# Patient Record
Sex: Male | Born: 1950 | Race: Black or African American | Hispanic: No | Marital: Married | Smoking: Never smoker
Health system: Southern US, Community
[De-identification: ages and names within clinical notes are randomized; demographics above are authoritative.]

## PROBLEM LIST (undated history)

## (undated) DIAGNOSIS — E119 Type 2 diabetes mellitus without complications: Secondary | ICD-10-CM

## (undated) DIAGNOSIS — M24661 Ankylosis, right knee: Secondary | ICD-10-CM

## (undated) DIAGNOSIS — M1990 Unspecified osteoarthritis, unspecified site: Secondary | ICD-10-CM

## (undated) DIAGNOSIS — E785 Hyperlipidemia, unspecified: Secondary | ICD-10-CM

## (undated) DIAGNOSIS — I10 Essential (primary) hypertension: Secondary | ICD-10-CM

## (undated) DIAGNOSIS — K5730 Diverticulosis of large intestine without perforation or abscess without bleeding: Secondary | ICD-10-CM

## (undated) DIAGNOSIS — N401 Enlarged prostate with lower urinary tract symptoms: Secondary | ICD-10-CM

## (undated) HISTORY — PX: JOINT REPLACEMENT: SHX530

## (undated) HISTORY — DX: Type 2 diabetes mellitus without complications: E11.9

## (undated) HISTORY — PX: COLONOSCOPY W/ BIOPSIES: SHX1374

---

## 2009-07-11 ENCOUNTER — Ambulatory Visit: Payer: Self-pay | Admitting: Orthopedic Surgery

## 2014-04-30 HISTORY — PX: COLONOSCOPY W/ BIOPSIES: SHX1374

## 2014-06-01 ENCOUNTER — Ambulatory Visit: Payer: Self-pay | Admitting: Gastroenterology

## 2017-09-11 ENCOUNTER — Other Ambulatory Visit: Payer: Self-pay | Admitting: Orthopedic Surgery

## 2017-09-11 ENCOUNTER — Ambulatory Visit
Admission: RE | Admit: 2017-09-11 | Discharge: 2017-09-11 | Disposition: A | Discharge status: Home / Self Care | Payer: BC Managed Care – PPO | Source: Ambulatory Visit | Attending: Orthopedic Surgery | Admitting: Orthopedic Surgery

## 2017-09-11 DIAGNOSIS — R52 Pain, unspecified: Secondary | ICD-10-CM

## 2017-09-11 DIAGNOSIS — M25461 Effusion, right knee: Secondary | ICD-10-CM | POA: Diagnosis not present

## 2017-09-11 DIAGNOSIS — M170 Bilateral primary osteoarthritis of knee: Secondary | ICD-10-CM | POA: Insufficient documentation

## 2019-06-12 ENCOUNTER — Ambulatory Visit: Payer: BC Managed Care – PPO | Attending: Internal Medicine

## 2019-06-12 DIAGNOSIS — Z23 Encounter for immunization: Secondary | ICD-10-CM | POA: Insufficient documentation

## 2019-06-12 NOTE — Progress Notes (Signed)
   Covid-19 Vaccination Clinic  Name:  Steven Buck    MRN: [id number] DOB: 26-Nov-1950  06/12/2019  [name] was observed post Covid-19 immunization for 15 minutes without incidence. He was provided with Vaccine Information Sheet and instruction to access the V-Safe system.   [name] was instructed to call 911 with any severe reactions post vaccine: Marland Kitchen Difficulty breathing  . Swelling of your face and throat  . A fast heartbeat  . A bad rash all over your body  . Dizziness and weakness    Immunizations Administered    Name Date Dose VIS Date Route   Moderna COVID-19 Vaccine 06/12/2019  9:50 AM 0.5 mL 03/31/2019 Intramuscular   Manufacturer: Moderna   Lot: [id number]   NDC: 83358-251-89

## 2019-07-13 ENCOUNTER — Ambulatory Visit: Payer: BC Managed Care – PPO | Attending: Internal Medicine

## 2019-07-13 DIAGNOSIS — Z23 Encounter for immunization: Secondary | ICD-10-CM

## 2019-07-13 NOTE — Progress Notes (Signed)
   Covid-19 Vaccination Clinic  Name:  ADIEL ERNEY    MRN: [id number] DOB: 09-29-1950  07/13/2019  Mr. Cookson was observed post Covid-19 immunization for 15 minutes without incident. He was provided with Vaccine Information Sheet and instruction to access the V-Safe system.   Mr. Blessinger was instructed to call 911 with any severe reactions post vaccine: Marland Kitchen Difficulty breathing  . Swelling of face and throat  . A fast heartbeat  . A bad rash all over body  . Dizziness and weakness   Immunizations Administered    Name Date Dose VIS Date Route   Moderna COVID-19 Vaccine 07/13/2019 10:04 AM 0.5 mL 03/31/2019 Intramuscular   Manufacturer: Moderna   Lot: [id number]   NDC: 57473-403-70

## 2019-07-27 IMAGING — CR DG KNEE STANDING AP BILAT
1 series · 1 of 1 positions shown · non-contrast
Comparison: None.

CLINICAL DATA: History of arthritis.  Pain.

EXAM:
BILATERAL KNEES STANDING - 1 VIEW; RIGHT KNEE - 1-2 VIEW

[dg knee bilateral standing ap]
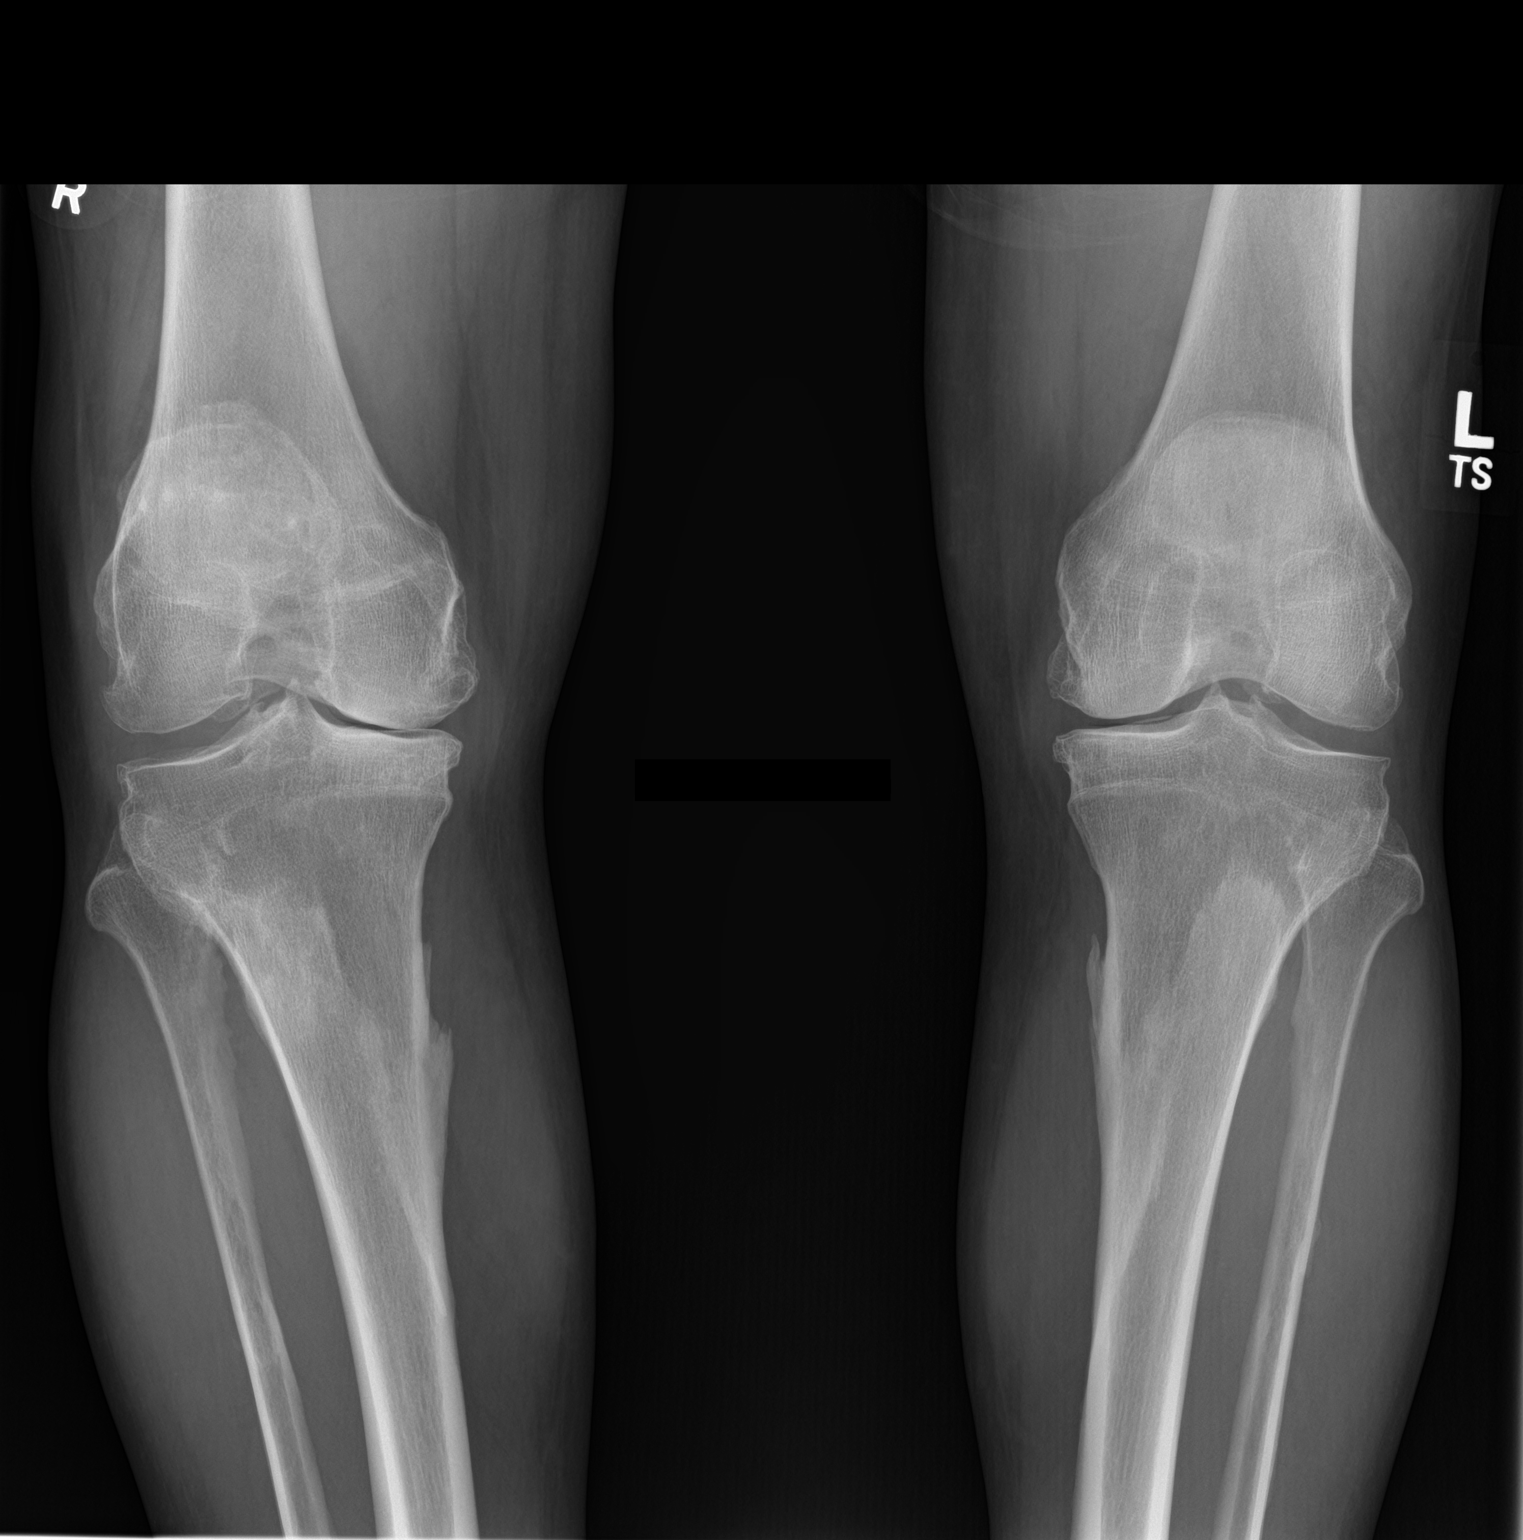

[1 of 1 positions shown; findings below may reference images not displayed]

FINDINGS: AP standing views of the knees bilaterally demonstrate joint space
narrowing and spurring most pronounced in the medial compartments.
Lateral view of the right knee demonstrates severe osteoarthritic
changes in the patellofemoral compartment with joint space narrowing
and extensive spurring. Small to moderate joint effusion within the
right knee. Cannot determine joint effusion on the left without a
lateral view. No acute bony abnormality. Specifically, no fracture,
subluxation, or dislocation.
IMPRESSION: Moderate bilateral degenerative changes as above. No acute bony
abnormality.

Small to moderate right joint effusion.

## 2020-04-30 HISTORY — PX: REPLACEMENT TOTAL KNEE: SUR1224

## 2020-10-19 HISTORY — PX: TOTAL KNEE ARTHROPLASTY: SHX125

## 2020-10-21 ENCOUNTER — Emergency Department: Payer: BC Managed Care – PPO

## 2020-10-21 ENCOUNTER — Emergency Department
Admission: EM | Admit: 2020-10-21 | Discharge: 2020-10-21 | Disposition: A | Discharge status: Home / Self Care | Payer: BC Managed Care – PPO | Attending: Emergency Medicine | Admitting: Emergency Medicine

## 2020-10-21 ENCOUNTER — Other Ambulatory Visit: Payer: Self-pay

## 2020-10-21 DIAGNOSIS — E860 Dehydration: Secondary | ICD-10-CM | POA: Insufficient documentation

## 2020-10-21 DIAGNOSIS — Z7984 Long term (current) use of oral hypoglycemic drugs: Secondary | ICD-10-CM | POA: Diagnosis not present

## 2020-10-21 DIAGNOSIS — R42 Dizziness and giddiness: Secondary | ICD-10-CM | POA: Diagnosis present

## 2020-10-21 DIAGNOSIS — R7303 Prediabetes: Secondary | ICD-10-CM | POA: Diagnosis not present

## 2020-10-21 DIAGNOSIS — M25561 Pain in right knee: Secondary | ICD-10-CM | POA: Insufficient documentation

## 2020-10-21 DIAGNOSIS — I951 Orthostatic hypotension: Secondary | ICD-10-CM | POA: Diagnosis not present

## 2020-10-21 DIAGNOSIS — W1830XA Fall on same level, unspecified, initial encounter: Secondary | ICD-10-CM | POA: Insufficient documentation

## 2020-10-21 DIAGNOSIS — W19XXXA Unspecified fall, initial encounter: Secondary | ICD-10-CM

## 2020-10-21 LAB — URINALYSIS, COMPLETE (UACMP) WITH MICROSCOPIC
Bacteria, UA: NONE SEEN
Bilirubin Urine: NEGATIVE
Glucose, UA: 50 mg/dL — AB
Hgb urine dipstick: NEGATIVE
Ketones, ur: NEGATIVE mg/dL
Leukocytes,Ua: NEGATIVE
Nitrite: NEGATIVE
Protein, ur: NEGATIVE mg/dL
Specific Gravity, Urine: 1.014 (ref 1.005–1.030)
Squamous Epithelial / HPF: NONE SEEN (ref 0–5)
pH: 7 (ref 5.0–8.0)

## 2020-10-21 LAB — CBC
HCT: 34.8 % — ABNORMAL LOW (ref 39.0–52.0)
Hemoglobin: 12 g/dL — ABNORMAL LOW (ref 13.0–17.0)
MCH: 30.8 pg (ref 26.0–34.0)
MCHC: 34.5 g/dL (ref 30.0–36.0)
MCV: 89.2 fL (ref 80.0–100.0)
Platelets: 218 10*3/uL (ref 150–400)
RBC: 3.9 MIL/uL — ABNORMAL LOW (ref 4.22–5.81)
RDW: 12.8 % (ref 11.5–15.5)
WBC: 11.2 10*3/uL — ABNORMAL HIGH (ref 4.0–10.5)
nRBC: 0 % (ref 0.0–0.2)

## 2020-10-21 LAB — TROPONIN I (HIGH SENSITIVITY): Troponin I (High Sensitivity): 12 ng/L (ref <18)

## 2020-10-21 LAB — BASIC METABOLIC PANEL
Anion gap: 7 (ref 5–15)
BUN: 12 mg/dL (ref 8–23)
CO2: 26 mmol/L (ref 22–32)
Calcium: 8.2 mg/dL — ABNORMAL LOW (ref 8.9–10.3)
Chloride: 103 mmol/L (ref 98–111)
Creatinine, Ser: 1 mg/dL (ref 0.61–1.24)
GFR, Estimated: >60 mL/min (ref >60)
Glucose, Bld: 131 mg/dL — ABNORMAL HIGH (ref 70–99)
Potassium: 5 mmol/L (ref 3.5–5.1)
Sodium: 136 mmol/L (ref 135–145)

## 2020-10-21 MED ORDER — LACTATED RINGERS IV BOLUS
1000.0000 mL | Freq: Once | INTRAVENOUS | Status: AC
  Administered 2020-10-21: 1000 mL via INTRAVENOUS

## 2020-10-21 NOTE — ED Provider Notes (Signed)
[facility] Emergency Department Provider Note  ____________________________________________   Event Date/Time   First MD Initiated Contact with Patient 10/21/20 [phone]     (approximate)  I have reviewed the triage vital signs and the nursing notes.   HISTORY  Chief Complaint Fall and Dizziness   HPI Steven Buck is a 70 y.o. male with a past medical history of HDL and prediabetes on metformin as well as recent right knee replacement on 6/22 presents for assessment of some dizziness and a fall today.  Patient states he has been feeling a little dizzy since the surgery and today was using his walker in his home when he states it went forward a little faster than he intended to when he fell backwards onto his bottom.  He states he currently does not have any pain in his bottom back and did not hit his head or have loss of consciousness.  He states he has little soreness in his knee and otherwise has been using his walker without too much difficulty.  He did take an oxycodone this morning.  He thinks he has been probably drinking a little less and eating less than usual denies any other recent sick symptoms including pain in his right ankle, hip, left lower extremity, back, chest, abdomen, head, ears, eyes, nose or throat.  No recent cough, fever, rash or other recent falls or injuries.  No other acute concerns at this time.  He is on ASA but no other anticoagulation.  Patient states he has no new pain in his knee and did not hit his knee experience any acute change in his knee pain after a fall.         No past medical history on file.  There are no problems to display for this patient.   Prior to Admission medications   Not on File    Allergies Patient has no allergy information on record.  No family history on file.  Social History    Review of Systems  Review of Systems  Constitutional:  Positive for malaise/fatigue. Negative for chills and  fever.  HENT:  Negative for sore throat.   Eyes:  Negative for pain.  Respiratory:  Negative for cough and stridor.   Cardiovascular:  Negative for chest pain.  Gastrointestinal:  Negative for vomiting.  Genitourinary:  Negative for dysuria.  Musculoskeletal:  Positive for joint pain.  Skin:  Negative for rash.  Neurological:  Positive for dizziness. Negative for seizures and headaches.  Psychiatric/Behavioral:  Negative for suicidal ideas.   All other systems reviewed and are negative.    ____________________________________________   PHYSICAL EXAM:  VITAL SIGNS: ED Triage Vitals  Enc Vitals Group     BP 10/21/20 0908 (!) 107/55     Pulse Rate 10/21/20 0908 66     Resp 10/21/20 0908 18     Temp 10/21/20 0908 99.3 F (37.4 C)     Temp Source 10/21/20 0908 Oral     SpO2 10/21/20 0908 99 %     Weight 10/21/20 0909 185 lb (83.9 kg)     Height 10/21/20 0909 6' (1.829 m)     Head Circumference --      Peak Flow --      Pain Score 10/21/20 0908 0     Pain Loc --      Pain Edu? --      Excl. in GC? --    Vitals:   10/21/20 1200 10/21/20 1230  BP:  126/74 121/69  Pulse:  68  Resp:  18  Temp:    SpO2:  100%   Physical Exam Vitals and nursing note reviewed.  Constitutional:      Appearance: He is well-developed.  HENT:     Head: Normocephalic and atraumatic.     Right Ear: External ear normal.     Left Ear: External ear normal.     Nose: Nose normal.     Mouth/Throat:     Mouth: Mucous membranes are dry.  Eyes:     Conjunctiva/sclera: Conjunctivae normal.  Cardiovascular:     Rate and Rhythm: Normal rate and regular rhythm.     Heart sounds: No murmur heard. Pulmonary:     Effort: Pulmonary effort is normal. No respiratory distress.     Breath sounds: Normal breath sounds.  Abdominal:     Palpations: Abdomen is soft.     Tenderness: There is no abdominal tenderness.  Musculoskeletal:     Cervical back: Neck supple.  Skin:    General: Skin is warm and dry.      Capillary Refill: Capillary refill takes 2 to 3 seconds.  Neurological:     Mental Status: He is alert and oriented to person, place, and time.    .  Some edema around the right knee with dressing in place.  No bleeding or drainage.  Compartments are soft.  2+ DP and radial pulses.  No tenderness step-offs deformities over the C/T/L-spine.  No trauma to the face scalp head neck chest abdomen or back.  No evidence of new trauma to the extremities. ____________________________________________   LABS (all labs ordered are listed, but only abnormal results are displayed)  Labs Reviewed  BASIC METABOLIC PANEL - Abnormal; Notable for the following components:      Result Value   Glucose, Bld 131 (*)    Calcium 8.2 (*)    All other components within normal limits  CBC - Abnormal; Notable for the following components:   WBC 11.2 (*)    RBC 3.90 (*)    Hemoglobin 12.0 (*)    HCT 34.8 (*)    All other components within normal limits  URINALYSIS, COMPLETE (UACMP) WITH MICROSCOPIC - Abnormal; Notable for the following components:   Color, Urine YELLOW (*)    APPearance CLEAR (*)    Glucose, UA 50 (*)    All other components within normal limits  CBG MONITORING, ED  TROPONIN I (HIGH SENSITIVITY)   ____________________________________________  EKG  Sinus rhythm with a ventricular rate of 66, normal axis, somewhat peaked T waves without any other clear evidence of acute ischemia or significant arrhythmia. ____________________________________________  RADIOLOGY  ED MD interpretation: Chest x-ray is unremarkable for evidence of pneumonia, pneumothorax, effusion, edema or other clear acute intrathoracic process.  Ultrasound of the right lower extremity shows no evidence of DVT.  Official radiology report(s): DG Chest 2 View  Result Date: 10/21/2020 CLINICAL DATA:  Pt here with a fall and dizziness. Pt had a knee replacement on 10/19/20 and has been dizzy and clammy since yesterday. Pt  was walking with his walker today and fell and landed on his butt. Pt denies any trouble breathing or pain in chest. EXAM: CHEST - 2 VIEW COMPARISON:  None. FINDINGS: The heart size and mediastinal contours are within normal limits. Both lungs are clear. No pleural effusion or pneumothorax. The visualized skeletal structures are unremarkable. IMPRESSION: No active cardiopulmonary disease. Electronically Signed   By: Renard Hamper.D.  On: 10/21/2020 10:36   US Venous Img Lower Unilateral Right  Result Date: 10/21/2020 CLINICAL DATA:  Postoperative leg swelling EXAM: RIGHT LOWER EXTREMITY VENOUS DOPPLER ULTRASOUND TECHNIQUE: Gray-scale sonography with compression, as well as color and duplex ultrasound, were performed to evaluate the deep venous system(s) from the level of the common femoral vein through the popliteal and proximal calf veins. COMPARISON:  None. FINDINGS: VENOUS Normal compressibility of the common femoral, superficial femoral, and popliteal veins, as well as the visualized calf veins. Visualized portions of profunda femoral vein and great saphenous vein unremarkable. No filling defects to suggest DVT on grayscale or color Doppler imaging. Doppler waveforms show normal direction of venous flow, normal respiratory plasticity and response to augmentation. Limited views of the contralateral common femoral vein are unremarkable. OTHER None. Limitations: none IMPRESSION: No evidence of right lower extremity DVT. Electronically Signed   By: Guadlupe SpanishPraneil  Patel M.D.   On: 10/21/2020 11:21    ____________________________________________   PROCEDURES  Procedure(s) performed (including Critical Care):  .1-3 Lead EKG Interpretation  Date/Time: 10/21/2020 1:00 PM Performed by: Gilles ChiquitoSmith, Khizar Fiorella P, MD Authorized by: Gilles ChiquitoSmith, Luiz Trumpower P, MD     Interpretation: normal     ECG rate assessment: normal     Rhythm: sinus rhythm     Ectopy: none     Conduction: normal      ____________________________________________   INITIAL IMPRESSION / ASSESSMENT AND PLAN / ED COURSE      Patient presents with above-stated exam for assessment after a fall that occurred earlier this morning.  This in the setting of right knee replacement.  Patient dates he has been feeling little dizzy since the procedure and thinks his roller got little help him this morning.  He is adamant he did not lose consciousness restraints any other associated recent sick symptoms.  He thinks he fell onto his bottom only denies any back pain or any other acute pain  Overall history is most consistent with likely mechanical fall today in the setting of 2 days of dizziness after recent surgery.  Regard to his dizziness patient is noted to be orthostatic and endorses eating and drinking less for the last couple days.  Chest x-ray is unremarkable for evidence of pneumonia, pneumothorax, effusion, edema or other clear acute intrathoracic process.  Ultrasound of the right lower extremity shows no evidence of DVT.  Overall I have a low suspicion for PE at this time.  BC shows mild leukocytosis of 11.2 likely reactive in the setting of recent surgery and hemoglobin of 12 not suggestive of symptomatic anemia at this time.  BMP shows no significant electrode or metabolic derangements.  Troponin and ECG are not suggestive of ACS and there is no significant arrhythmia identified.  Urine is unremarkable.  Treated with IV fluids and improvement in his blood pressure.  Repeat standing blood pressure after IV fluids and p.o. duration is 121/69.  Patient also denies any dizziness on standing.  Advised him to hydrate and eat plenty of food and follow-up with his orthopedist.  He is amenable to plan.  Discharged stable condition.  Strict return precautions advised and discussed.       ____________________________________________   FINAL CLINICAL IMPRESSION(S) / ED DIAGNOSES  Final diagnoses:  Fall, initial  encounter  Orthostatic dizziness  Dehydration    Medications  lactated ringers bolus 1,000 mL (1,000 mLs Intravenous New Bag/Given 10/21/20 1025)     ED Discharge Orders     None  Note:  This document was prepared using Dragon voice recognition software and may include unintentional dictation errors.    Gilles Chiquito, MD 10/21/20 [phone]

## 2020-10-21 NOTE — ED Notes (Signed)
Pt taken for scans 

## 2020-10-21 NOTE — ED Triage Notes (Signed)
Pt here with a fall and dizziness. Pt had a knee replacement on 10/19/20 and has been dizzy and clammy since yesterday. Pt was walking with his walker today and fell and landed on his butt. Pt denies LOC.

## 2020-10-21 NOTE — ED Notes (Signed)
Dr. Albeiro Trompeter at bedside.

## 2022-07-02 ENCOUNTER — Other Ambulatory Visit: Payer: BC Managed Care – PPO

## 2022-07-02 ENCOUNTER — Other Ambulatory Visit: Payer: Self-pay | Admitting: Internal Medicine

## 2022-07-03 LAB — CMP14+EGFR
ALT: 20 IU/L (ref 0–44)
AST: 28 IU/L (ref 0–40)
Albumin/Globulin Ratio: 1.5 (ref 1.2–2.2)
Albumin: 4 g/dL (ref 3.9–4.9)
Alkaline Phosphatase: 62 IU/L (ref 44–121)
BUN/Creatinine Ratio: 15 (ref 10–24)
BUN: 18 mg/dL (ref 8–27)
Bilirubin Total: 0.4 mg/dL (ref 0.0–1.2)
CO2: 24 mmol/L (ref 20–29)
Calcium: 9.6 mg/dL (ref 8.6–10.2)
Chloride: 101 mmol/L (ref 96–106)
Creatinine, Ser: 1.19 mg/dL (ref 0.76–1.27)
Globulin, Total: 2.7 g/dL (ref 1.5–4.5)
Glucose: 99 mg/dL (ref 70–99)
Potassium: 5.5 mmol/L — ABNORMAL HIGH (ref 3.5–5.2)
Sodium: 139 mmol/L (ref 134–144)
Total Protein: 6.7 g/dL (ref 6.0–8.5)
eGFR: 66 mL/min/{1.73_m2} (ref >59)

## 2022-07-03 LAB — LIPID PANEL W/O CHOL/HDL RATIO
Cholesterol, Total: 157 mg/dL (ref 100–199)
HDL: 76 mg/dL (ref >39)
LDL Chol Calc (NIH): 70 mg/dL (ref 0–99)
Triglycerides: 54 mg/dL (ref 0–149)
VLDL Cholesterol Cal: 11 mg/dL (ref 5–40)

## 2022-07-03 LAB — HGB A1C W/O EAG: Hgb A1c MFr Bld: 6.4 % — ABNORMAL HIGH (ref 4.8–5.6)

## 2022-07-04 ENCOUNTER — Ambulatory Visit (INDEPENDENT_AMBULATORY_CARE_PROVIDER_SITE_OTHER): Payer: BC Managed Care – PPO | Admitting: Internal Medicine

## 2022-07-04 ENCOUNTER — Encounter: Payer: Self-pay | Admitting: Internal Medicine

## 2022-07-04 VITALS — BP 120/80 | HR 84 | Ht 72.0 in | Wt 196.8 lb

## 2022-07-04 DIAGNOSIS — E119 Type 2 diabetes mellitus without complications: Secondary | ICD-10-CM

## 2022-07-04 DIAGNOSIS — E7800 Pure hypercholesterolemia, unspecified: Secondary | ICD-10-CM

## 2022-07-04 LAB — GLUCOSE, POCT (MANUAL RESULT ENTRY): POC Glucose: 112 mg/dl — AB (ref 70–99)

## 2022-07-04 NOTE — Progress Notes (Signed)
Established Patient Office Visit  Subjective:  Patient ID: Steven Buck, male    DOB: 03-20-1951  Age: 72 y.o. MRN: [id number]  Chief Complaint  Patient presents with   Follow-up    Lab results    No new complaints, here for lab review and medication refills. Labs reviewed and notable for well controlled diabetes although A1c higher with LDL at target. Denies any hypoglyemic episodes.    Past Medical History:  Diagnosis Date   Diabetes mellitus without complication ([place])    Hypertension     History reviewed. No pertinent surgical history.  Social History   Socioeconomic History   Marital status: Married    Spouse name: Not on file   Number of children: Not on file   Years of education: Not on file   Highest education level: Not on file  Occupational History   Not on file  Tobacco Use   Smoking status: Never   Smokeless tobacco: Never  Substance and Sexual Activity   Alcohol use: Never   Drug use: Never   Sexual activity: Not on file  Other Topics Concern   Not on file  Social History Narrative   Not on file   Social Determinants of Health   Financial Resource Strain: Not on file  Food Insecurity: Not on file  Transportation Needs: Not on file  Physical Activity: Not on file  Stress: Not on file  Social Connections: Not on file  Intimate Partner Violence: Not on file    History reviewed. No pertinent family history.  No Known Allergies  Review of Systems  Constitutional: Negative.   HENT: Negative.    Eyes: Negative.   Respiratory: Negative.    Cardiovascular: Negative.   Gastrointestinal: Negative.   Genitourinary: Negative.        Delayed ejaculation  Musculoskeletal:  Positive for joint pain.       Right knee arthralgia  Skin: Negative.   Neurological: Negative.   Endo/Heme/Allergies: Negative.        Objective:   BP 120/80   Pulse 84   Ht 6' (1.829 m)   Wt 196 lb 12.8 oz (89.3 kg)   SpO2 96%   BMI 26.69 kg/m   Vitals:    07/04/22 1003  BP: 120/80  Pulse: 84  Height: 6' (1.829 m)  Weight: 196 lb 12.8 oz (89.3 kg)  SpO2: 96%  BMI (Calculated): 26.69    Physical Exam Vitals reviewed.  Constitutional:      Appearance: Normal appearance.  HENT:     Head: Normocephalic.     Left Ear: There is no impacted cerumen.     Nose: Nose normal.     Mouth/Throat:     Mouth: Mucous membranes are moist.     Pharynx: No posterior oropharyngeal erythema.  Eyes:     Extraocular Movements: Extraocular movements intact.     Pupils: Pupils are equal, round, and reactive to light.  Cardiovascular:     Rate and Rhythm: Regular rhythm.     Chest Wall: PMI is not displaced.     Pulses: Normal pulses.     Heart sounds: Normal heart sounds. No murmur heard. Pulmonary:     Effort: Pulmonary effort is normal.     Breath sounds: Normal air entry. No rhonchi or rales.  Abdominal:     General: Abdomen is flat. Bowel sounds are normal. There is no distension.     Palpations: Abdomen is soft. There is no hepatomegaly, splenomegaly or mass.  Tenderness: There is no abdominal tenderness.  Musculoskeletal:     Cervical back: Normal range of motion and neck supple.     Right lower leg: No edema.     Left lower leg: No edema.     Comments: Limping gait  Skin:    General: Skin is warm and dry.  Neurological:     General: No focal deficit present.     Mental Status: He is alert and oriented to person, place, and time.     Cranial Nerves: No cranial nerve deficit.     Motor: No weakness.  Psychiatric:        Mood and Affect: Mood normal.        Behavior: Behavior normal.      Results for orders placed or performed in visit on 07/04/22  POCT Glucose (CBG)  Result Value Ref Range   POC Glucose 112 (A) 70 - 99 mg/dl    Recent Results (from the past 2160 hour(Ismael Karge))  CMP14+EGFR     Status: Abnormal   Collection Time: 07/02/22  9:40 AM  Result Value Ref Range   Glucose 99 70 - 99 mg/dL   BUN 18 8 - 27 mg/dL    Creatinine, Ser 1.19 0.76 - 1.27 mg/dL   eGFR 66 >59 mL/min/1.73   BUN/Creatinine Ratio 15 10 - 24   Sodium 139 134 - 144 mmol/L   Potassium 5.5 (H) 3.5 - 5.2 mmol/L   Chloride 101 96 - 106 mmol/L   CO2 24 20 - 29 mmol/L   Calcium 9.6 8.6 - 10.2 mg/dL   Total Protein 6.7 6.0 - 8.5 g/dL   Albumin 4.0 3.9 - 4.9 g/dL   Globulin, Total 2.7 1.5 - 4.5 g/dL   Albumin/Globulin Ratio 1.5 1.2 - 2.2   Bilirubin Total 0.4 0.0 - 1.2 mg/dL   Alkaline Phosphatase 62 44 - 121 IU/L   AST 28 0 - 40 IU/L   ALT 20 0 - 44 IU/L  Lipid Panel w/o Chol/HDL Ratio     Status: None   Collection Time: 07/02/22  9:40 AM  Result Value Ref Range   Cholesterol, Total 157 100 - 199 mg/dL   Triglycerides 54 0 - 149 mg/dL   HDL 76 >39 mg/dL   VLDL Cholesterol Cal 11 5 - 40 mg/dL   LDL Chol Calc (NIH) 70 0 - 99 mg/dL  Hgb A1c w/o eAG     Status: Abnormal   Collection Time: 07/02/22  9:40 AM  Result Value Ref Range   Hgb A1c MFr Bld 6.4 (H) 4.8 - 5.6 %    Comment:          Prediabetes: 5.7 - 6.4          Diabetes: >6.4          Glycemic control for adults with diabetes: <7.0   POCT Glucose (CBG)     Status: Abnormal   Collection Time: 07/04/22 10:15 AM  Result Value Ref Range   POC Glucose 112 (A) 70 - 99 mg/dl      Assessment & Plan:   Problem List Items Addressed This Visit   None Visit Diagnoses     Type 2 diabetes mellitus without complication, without long-term current use of insulin (HCC)    -  Primary   Relevant Medications   atorvastatin (LIPITOR) 10 MG tablet   aspirin EC 81 MG tablet   FARXIGA 10 MG TABS tablet   metFORMIN (GLUCOPHAGE-XR) 750 MG 24 hr tablet  pioglitazone (ACTOS) 30 MG tablet   Other Relevant Orders   POCT Glucose (CBG) (Completed)     1. Type 2 diabetes mellitus without complication, without long-term current use of insulin (HCC) - POCT Glucose (CBG) - Hemoglobin A1c; Future - Comprehensive metabolic panel; Future - Lipid panel; Future  2. Pure  hypercholesterolemia    No follow-ups on file.   Total time spent: 20 minutes  Volanda Napoleon, MD  07/04/2022

## 2022-09-05 IMAGING — CR DG CHEST 2V
2 series · 2 of 2 positions shown · non-contrast
Comparison: None.

CLINICAL DATA: Pt here with a fall and dizziness. Pt had a knee
replacement on 10/19/20 and has been dizzy and clammy since
yesterday. Pt was walking with his walker today and fell and landed
on his butt. Pt denies any trouble breathing or pain in chest.

EXAM:
CHEST - 2 VIEW

[chest lat]
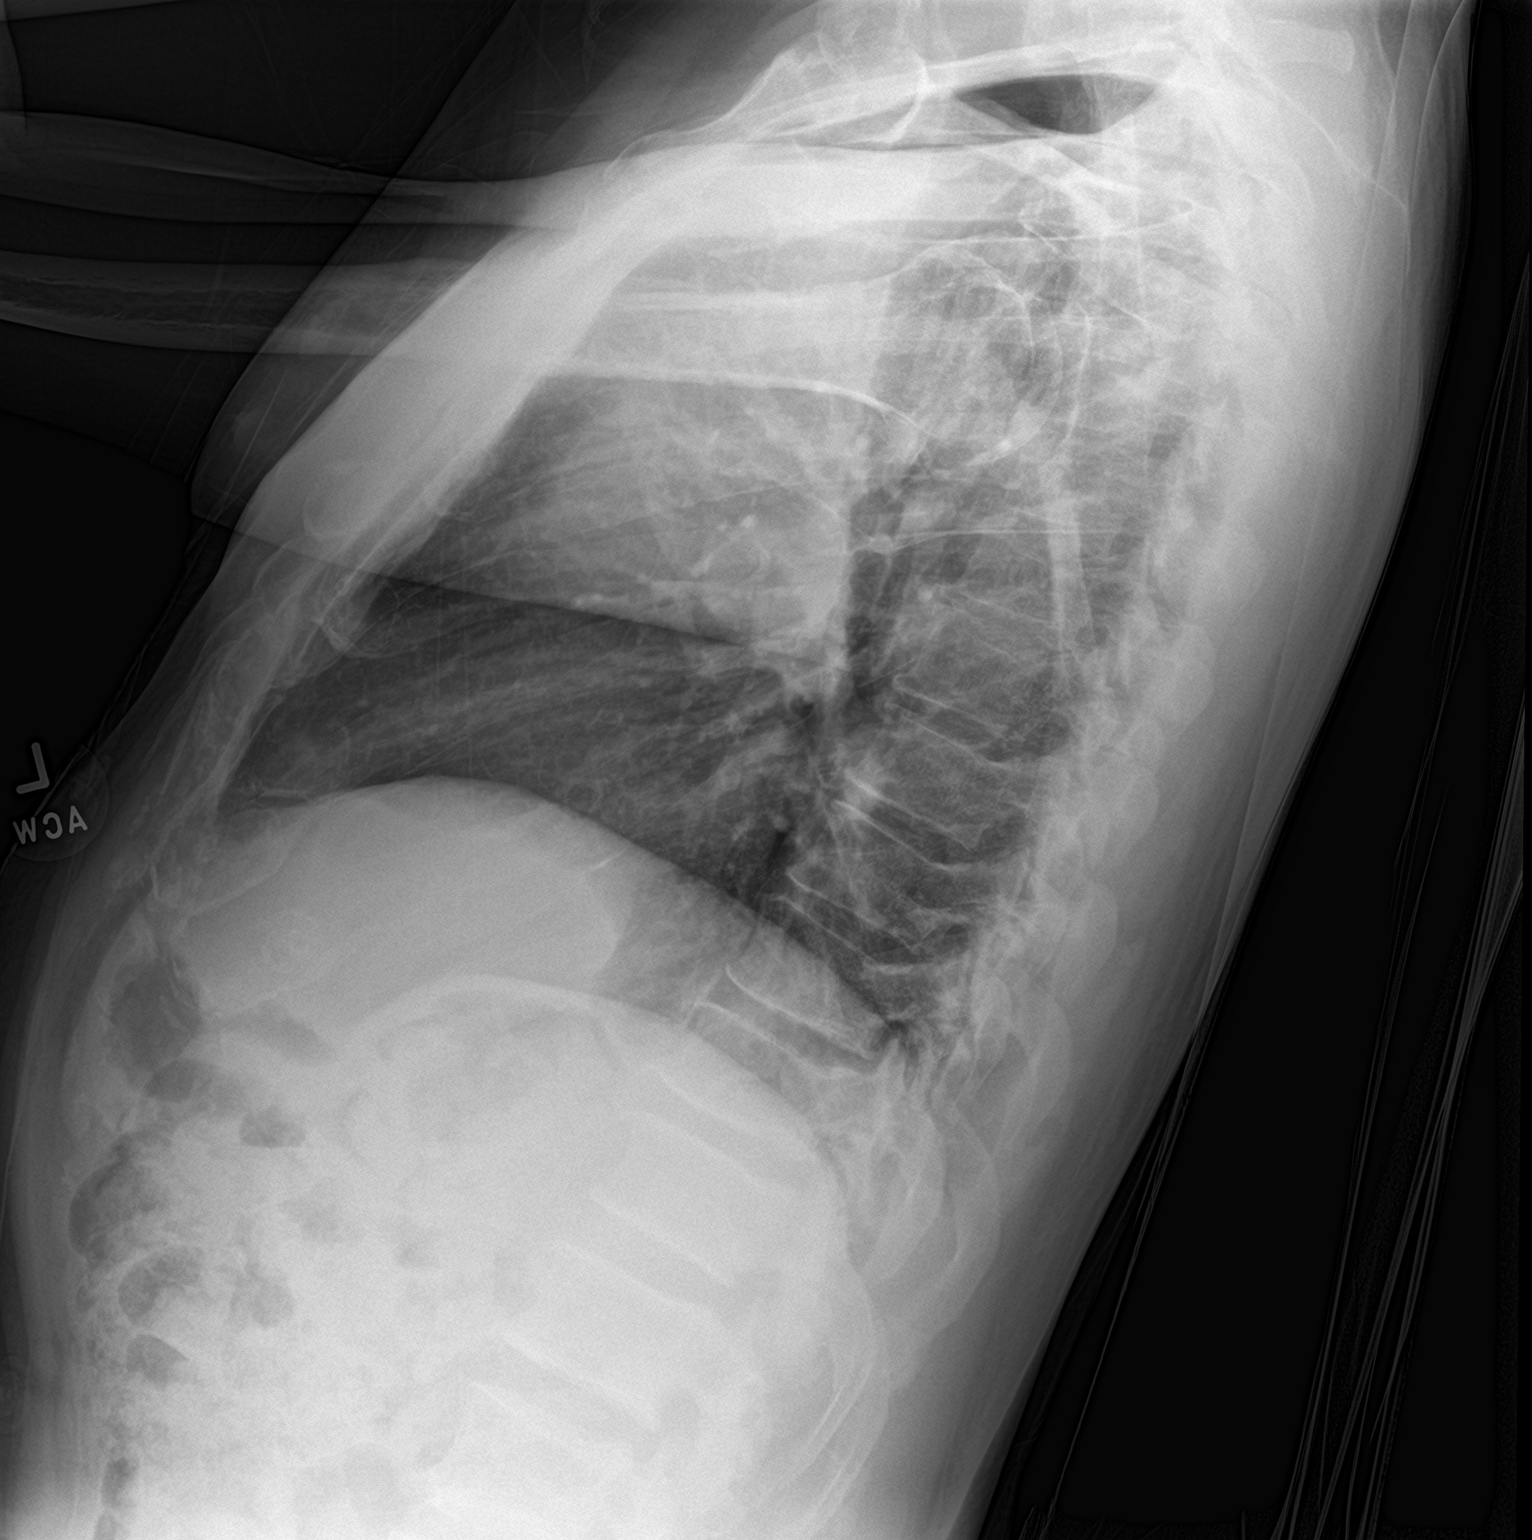

[chest ap]
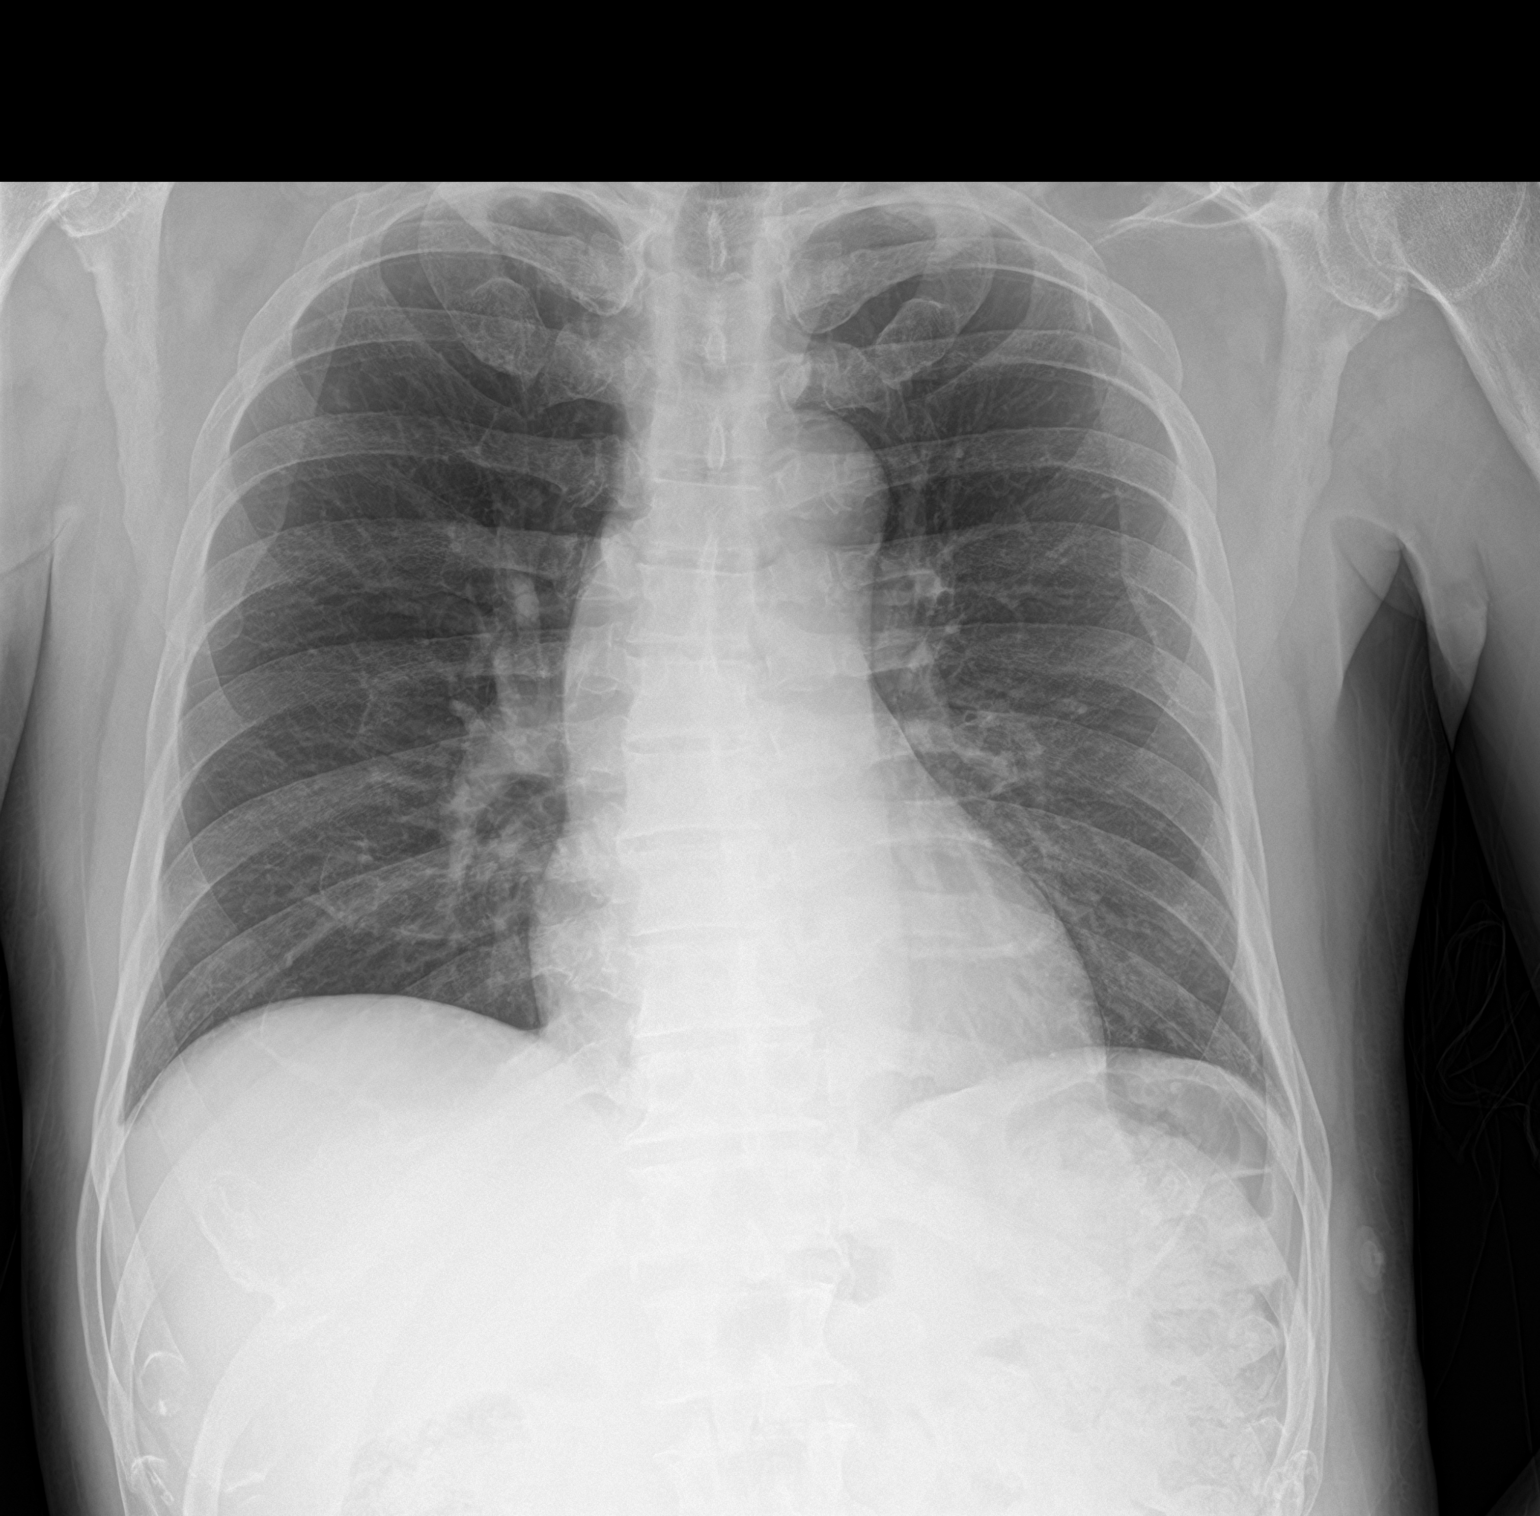

[2 of 2 positions shown; findings below may reference images not displayed]

FINDINGS: The heart size and mediastinal contours are within normal limits.
Both lungs are clear. No pleural effusion or pneumothorax. The
visualized skeletal structures are unremarkable.
IMPRESSION: No active cardiopulmonary disease.

## 2022-09-05 IMAGING — US US EXTREM LOW VENOUS*R*
1 series · 14 of 24 positions shown · non-contrast
Comparison: None.

CLINICAL DATA: Postoperative leg swelling

EXAM:
RIGHT LOWER EXTREMITY VENOUS DOPPLER ULTRASOUND
TECHNIQUE: Gray-scale sonography with compression, as well as color and duplex
ultrasound, were performed to evaluate the deep venous system(s)
from the level of the common femoral vein through the popliteal and
proximal calf veins.

[Series 1: us venous img lower uni right (dvt) · portal-venous · 14 of 33 slices shown]
[im 1/33]
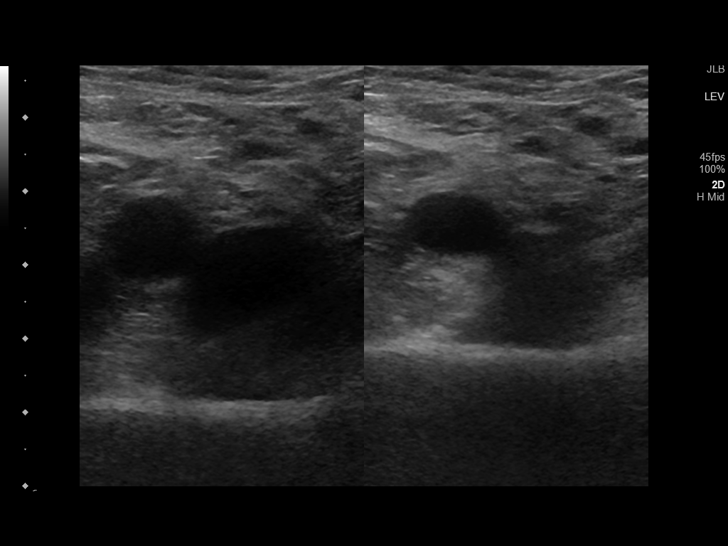
[im 3/33]
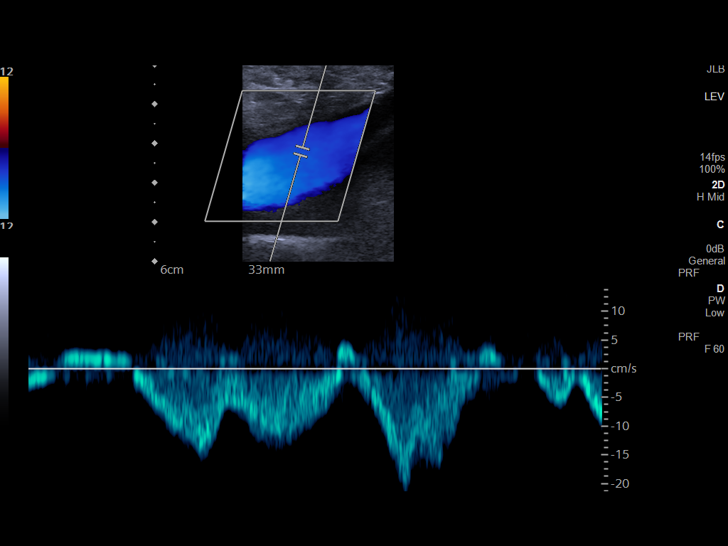
[im 6/33]
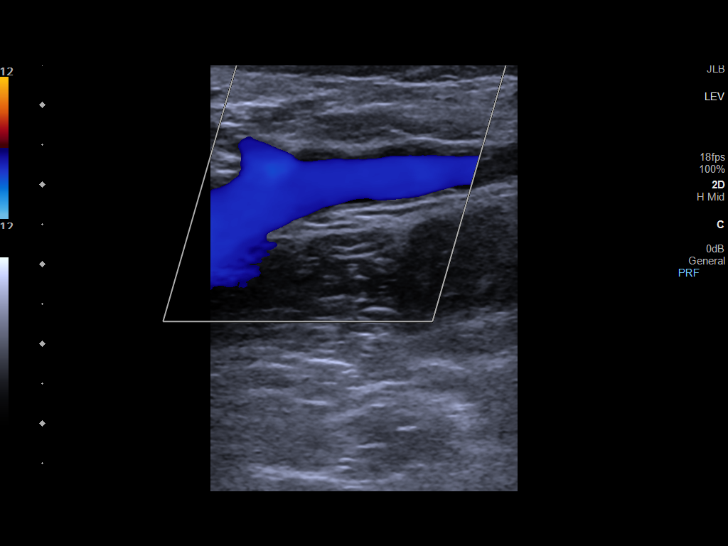
[im 9/33]
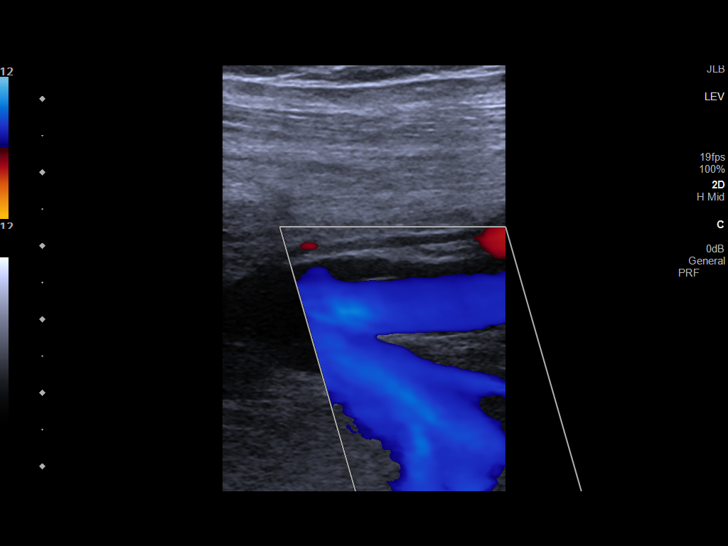
[im 10/33]
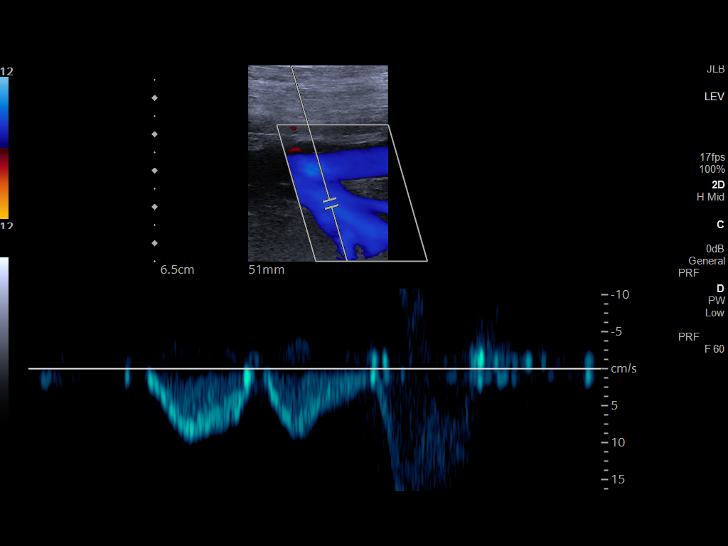
[im 13/33]
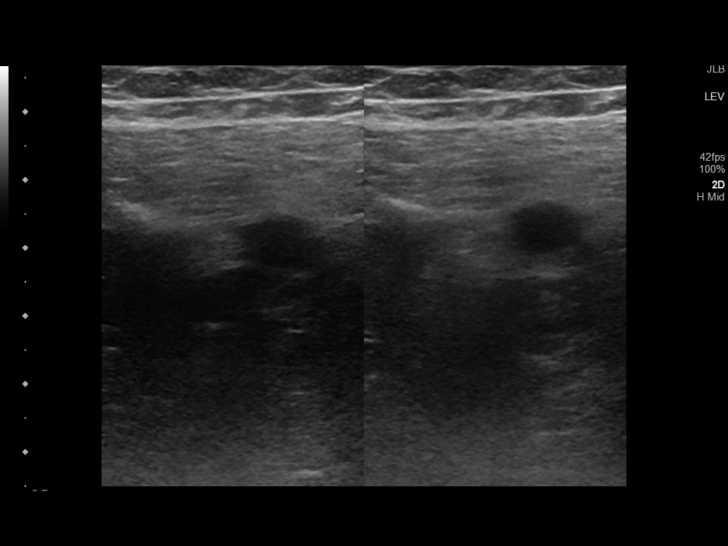
[im 16/33]
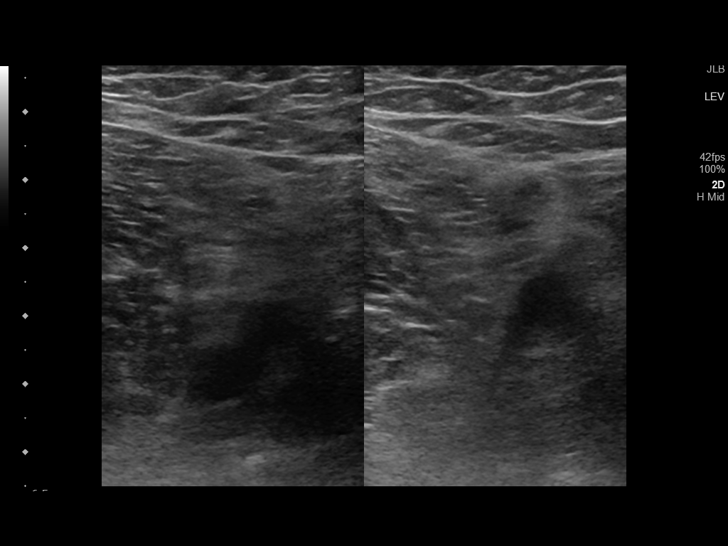
[im 17/33]
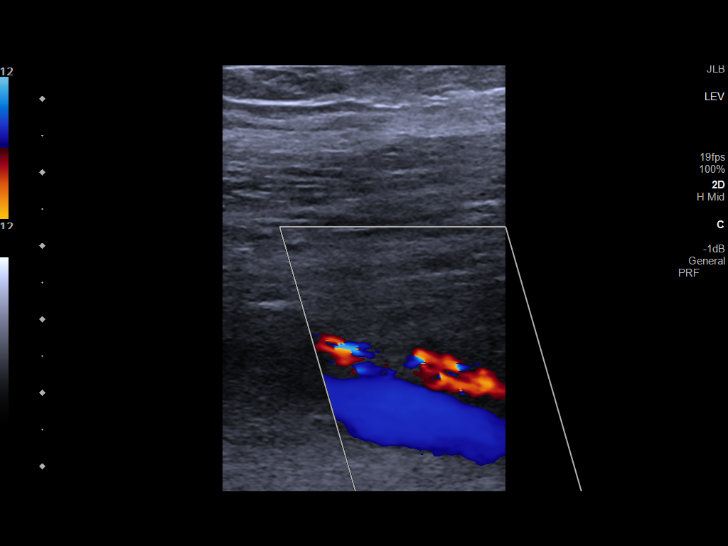
[im 20/33]
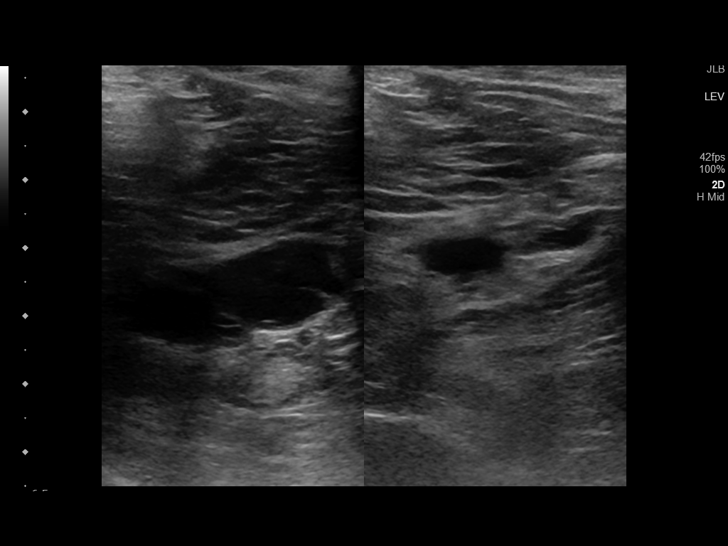
[im 23/33]
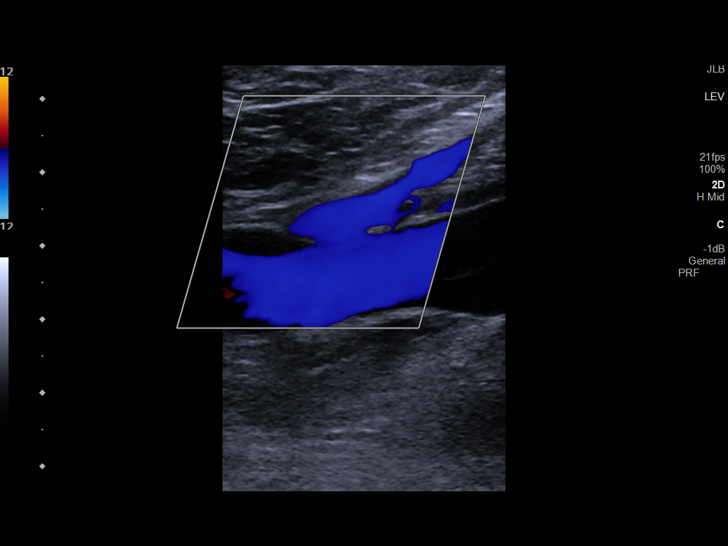
[im 26/33]
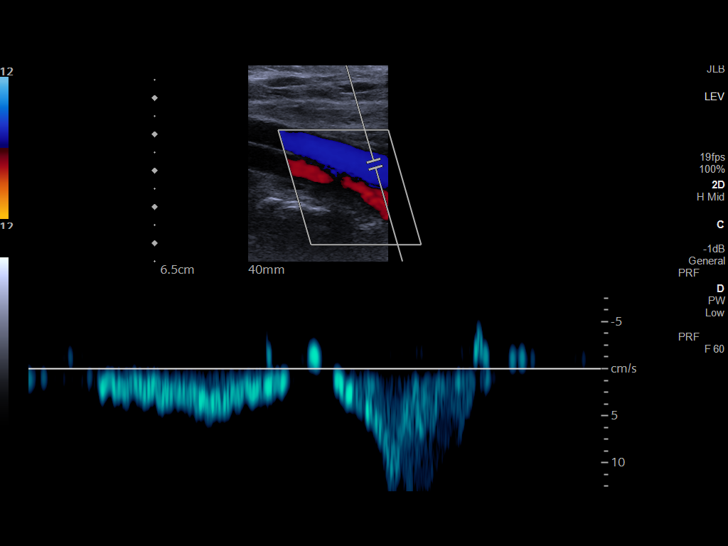
[im 27/33]
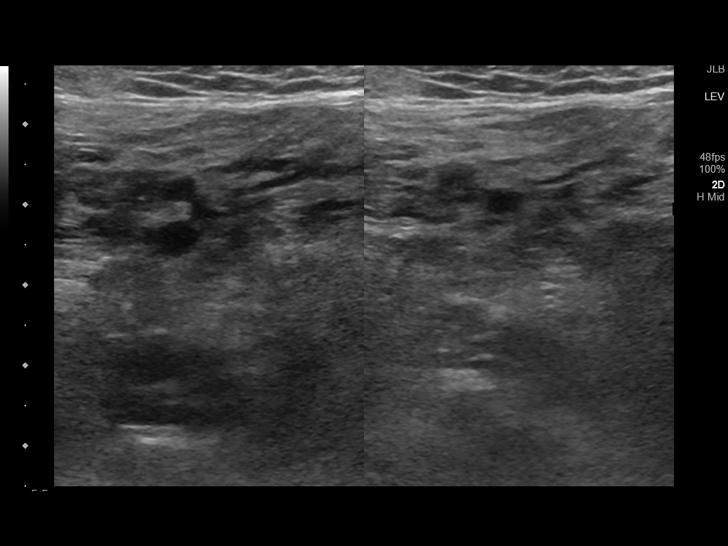
[im 30/33]
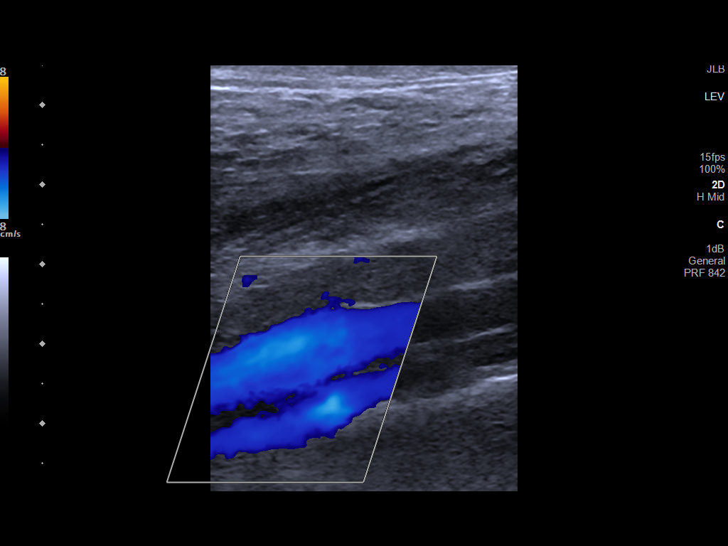
[im 33/33]
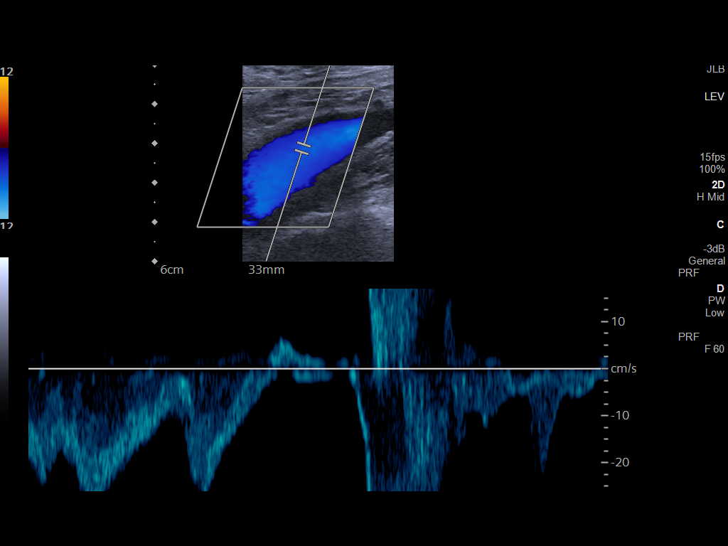

[14 of 24 positions shown; findings below may reference images not displayed]

FINDINGS: VENOUS

Normal compressibility of the common femoral, superficial femoral,
and popliteal veins, as well as the visualized calf veins.
Visualized portions of profunda femoral vein and great saphenous
vein unremarkable. No filling defects to suggest DVT on grayscale or
color Doppler imaging. Doppler waveforms show normal direction of
venous flow, normal respiratory plasticity and response to
augmentation.

Limited views of the contralateral common femoral vein are
unremarkable.

OTHER

None.

Limitations: none
IMPRESSION: No evidence of right lower extremity DVT.

## 2022-10-05 ENCOUNTER — Ambulatory Visit: Payer: BC Managed Care – PPO | Admitting: Internal Medicine

## 2022-10-05 ENCOUNTER — Other Ambulatory Visit: Payer: BC Managed Care – PPO

## 2022-10-05 DIAGNOSIS — E119 Type 2 diabetes mellitus without complications: Secondary | ICD-10-CM

## 2022-10-06 LAB — COMPREHENSIVE METABOLIC PANEL
ALT: 23 IU/L (ref 0–44)
AST: 28 IU/L (ref 0–40)
Albumin/Globulin Ratio: 1.5 (ref 1.2–2.2)
Albumin: 4 g/dL (ref 3.9–4.9)
Alkaline Phosphatase: 56 IU/L (ref 44–121)
BUN/Creatinine Ratio: 19 (ref 10–24)
BUN: 19 mg/dL (ref 8–27)
Bilirubin Total: 0.6 mg/dL (ref 0.0–1.2)
CO2: 26 mmol/L (ref 20–29)
Calcium: 9.4 mg/dL (ref 8.6–10.2)
Chloride: 104 mmol/L (ref 96–106)
Creatinine, Ser: 1 mg/dL (ref 0.76–1.27)
Globulin, Total: 2.7 g/dL (ref 1.5–4.5)
Glucose: 102 mg/dL — ABNORMAL HIGH (ref 70–99)
Potassium: 5.1 mmol/L (ref 3.5–5.2)
Sodium: 141 mmol/L (ref 134–144)
Total Protein: 6.7 g/dL (ref 6.0–8.5)
eGFR: 81 mL/min/{1.73_m2} (ref >59)

## 2022-10-06 LAB — LIPID PANEL
Chol/HDL Ratio: 1.9 ratio (ref 0.0–5.0)
Cholesterol, Total: 160 mg/dL (ref 100–199)
HDL: 84 mg/dL (ref >39)
LDL Chol Calc (NIH): 66 mg/dL (ref 0–99)
Triglycerides: 44 mg/dL (ref 0–149)
VLDL Cholesterol Cal: 10 mg/dL (ref 5–40)

## 2022-10-06 LAB — HEMOGLOBIN A1C
Est. average glucose Bld gHb Est-mCnc: 137 mg/dL
Hgb A1c MFr Bld: 6.4 % — ABNORMAL HIGH (ref 4.8–5.6)

## 2022-10-09 ENCOUNTER — Ambulatory Visit: Payer: BC Managed Care – PPO | Admitting: Internal Medicine

## 2022-10-09 ENCOUNTER — Encounter: Payer: Self-pay | Admitting: Internal Medicine

## 2022-10-09 VITALS — BP 118/82 | HR 52 | Ht 72.0 in | Wt 192.6 lb

## 2022-10-09 DIAGNOSIS — I10 Essential (primary) hypertension: Secondary | ICD-10-CM | POA: Diagnosis not present

## 2022-10-09 DIAGNOSIS — E119 Type 2 diabetes mellitus without complications: Secondary | ICD-10-CM | POA: Diagnosis not present

## 2022-10-09 DIAGNOSIS — N400 Enlarged prostate without lower urinary tract symptoms: Secondary | ICD-10-CM

## 2022-10-09 DIAGNOSIS — E7800 Pure hypercholesterolemia, unspecified: Secondary | ICD-10-CM | POA: Insufficient documentation

## 2022-10-09 LAB — POCT CBG (FASTING - GLUCOSE)-MANUAL ENTRY: Glucose Fasting, POC: 100 mg/dL — AB (ref 70–99)

## 2022-10-09 MED ORDER — METFORMIN HCL ER 750 MG PO TB24: 750.0000 mg | ORAL_TABLET | Freq: Every day | ORAL | 0 refills | Status: DC

## 2022-10-09 MED ORDER — FARXIGA 10 MG PO TABS: 10.0000 mg | ORAL_TABLET | Freq: Every day | ORAL | 2 refills | Status: AC

## 2022-10-09 MED ORDER — ATORVASTATIN CALCIUM 10 MG PO TABS: 10.0000 mg | ORAL_TABLET | Freq: Every day | ORAL | 0 refills | Status: DC

## 2022-10-09 MED ORDER — PIOGLITAZONE HCL 30 MG PO TABS: 30.0000 mg | ORAL_TABLET | Freq: Every day | ORAL | 0 refills | Status: DC

## 2022-10-09 NOTE — Progress Notes (Signed)
Established Patient Office Visit  Subjective:  Patient ID: Steven Buck, male    DOB: 04-19-1951  Age: 72 y.o. MRN: [id number]  Chief Complaint  Patient presents with   Follow-up    3 month follow up, discuss lab results.    No new complaints, here for lab review and medication refills. Labs reviewed and notable for well controlled diabetes, A1c at target, lipids at target with unremarkable cmp. Denies any hypoglycemic episodes and home bg readings have been at target. Scheduled to undergo revision of R knee replacement on 11/05/22.    No other concerns at this time.   Past Medical History:  Diagnosis Date   Diabetes mellitus without complication (HCC)     Past Surgical History:  Procedure Laterality Date   JOINT REPLACEMENT     REPLACEMENT TOTAL KNEE Right 2022    Social History   Socioeconomic History   Marital status: Married    Spouse name: Not on file   Number of children: Not on file   Years of education: Not on file   Highest education level: Not on file  Occupational History   Occupation: Janitor  Tobacco Use   Smoking status: Never   Smokeless tobacco: Never  Substance and Sexual Activity   Alcohol use: Yes   Drug use: Never   Sexual activity: Yes  Other Topics Concern   Not on file  Social History Narrative   Not on file   Social Determinants of Health   Financial Resource Strain: Not on file  Food Insecurity: Not on file  Transportation Needs: Not on file  Physical Activity: Not on file  Stress: Not on file  Social Connections: Not on file  Intimate Partner Violence: Not on file    No family history on file.  No Known Allergies  Review of Systems  Constitutional: Negative.   HENT: Negative.    Eyes: Negative.   Respiratory: Negative.    Cardiovascular: Negative.   Gastrointestinal: Negative.   Genitourinary: Negative.        Delayed ejaculation  Musculoskeletal:  Positive for joint pain.       Right knee arthralgia  Skin:  Negative.   Neurological: Negative.   Endo/Heme/Allergies: Negative.        Objective:   BP 118/82   Pulse (!) 52   Ht 6' (1.829 m)   Wt 192 lb 9.6 oz (87.4 kg)   SpO2 96%   BMI 26.12 kg/m   Vitals:   10/09/22 1126  BP: 118/82  Pulse: (!) 52  Height: 6' (1.829 m)  Weight: 192 lb 9.6 oz (87.4 kg)  SpO2: 96%  BMI (Calculated): 26.12    Physical Exam Vitals reviewed.  Constitutional:      Appearance: Normal appearance.  HENT:     Head: Normocephalic.     Left Ear: There is no impacted cerumen.     Nose: Nose normal.     Mouth/Throat:     Mouth: Mucous membranes are moist.     Pharynx: No posterior oropharyngeal erythema.  Eyes:     Extraocular Movements: Extraocular movements intact.     Pupils: Pupils are equal, round, and reactive to light.  Cardiovascular:     Rate and Rhythm: Regular rhythm.     Chest Wall: PMI is not displaced.     Pulses: Normal pulses.     Heart sounds: Normal heart sounds. No murmur heard. Pulmonary:     Effort: Pulmonary effort is normal.  Breath sounds: Normal air entry. No rhonchi or rales.  Abdominal:     General: Abdomen is flat. Bowel sounds are normal. There is no distension.     Palpations: Abdomen is soft. There is no hepatomegaly, splenomegaly or mass.     Tenderness: There is no abdominal tenderness.  Musculoskeletal:     Cervical back: Normal range of motion and neck supple.     Right lower leg: No edema.     Left lower leg: No edema.     Comments: Limping gait  Skin:    General: Skin is warm and dry.  Neurological:     General: No focal deficit present.     Mental Status: He is alert and oriented to person, place, and time.     Cranial Nerves: No cranial nerve deficit.     Motor: No weakness.  Psychiatric:        Mood and Affect: Mood normal.        Behavior: Behavior normal.      Results for orders placed or performed in visit on 10/09/22  POCT CBG (Fasting - Glucose)  Result Value Ref Range   Glucose  Fasting, POC 100 (A) 70 - 99 mg/dL    Recent Results (from the past 2160 hour(Dalya Maselli))  Lipid panel     Status: None   Collection Time: 10/05/22 10:45 AM  Result Value Ref Range   Cholesterol, Total 160 100 - 199 mg/dL   Triglycerides 44 0 - 149 mg/dL   HDL 84 >40 mg/dL   VLDL Cholesterol Cal 10 5 - 40 mg/dL   LDL Chol Calc (NIH) 66 0 - 99 mg/dL   Chol/HDL Ratio 1.9 0.0 - 5.0 ratio    Comment:                                   T. Chol/HDL Ratio                                             Men  Women                               1/2 Avg.Risk  3.4    3.3                                   Avg.Risk  5.0    4.4                                2X Avg.Risk  9.6    7.1                                3X Avg.Risk 23.4   11.0   Comprehensive metabolic panel     Status: Abnormal   Collection Time: 10/05/22 10:45 AM  Result Value Ref Range   Glucose 102 (H) 70 - 99 mg/dL   BUN 19 8 - 27 mg/dL   Creatinine, Ser 9.81 0.76 - 1.27 mg/dL   eGFR 81 >19 JY/NWG/9.56   BUN/Creatinine Ratio 19 10 - 24  Sodium 141 134 - 144 mmol/L   Potassium 5.1 3.5 - 5.2 mmol/L   Chloride 104 96 - 106 mmol/L   CO2 26 20 - 29 mmol/L   Calcium 9.4 8.6 - 10.2 mg/dL   Total Protein 6.7 6.0 - 8.5 g/dL   Albumin 4.0 3.9 - 4.9 g/dL   Globulin, Total 2.7 1.5 - 4.5 g/dL   Albumin/Globulin Ratio 1.5 1.2 - 2.2   Bilirubin Total 0.6 0.0 - 1.2 mg/dL   Alkaline Phosphatase 56 44 - 121 IU/L   AST 28 0 - 40 IU/L   ALT 23 0 - 44 IU/L  Hemoglobin A1c     Status: Abnormal   Collection Time: 10/05/22 10:45 AM  Result Value Ref Range   Hgb A1c MFr Bld 6.4 (H) 4.8 - 5.6 %    Comment:          Prediabetes: 5.7 - 6.4          Diabetes: >6.4          Glycemic control for adults with diabetes: <7.0    Est. average glucose Bld gHb Est-mCnc 137 mg/dL  POCT CBG (Fasting - Glucose)     Status: Abnormal   Collection Time: 10/09/22 11:32 AM  Result Value Ref Range   Glucose Fasting, POC 100 (A) 70 - 99 mg/dL      Assessment & Plan:    As per problem list. Strict low calorie diet, low cholesterol and low fat diet and exercise as much as possible.  Problem List Items Addressed This Visit       Cardiovascular and Mediastinum   Primary hypertension   Relevant Medications   atorvastatin (LIPITOR) 10 MG tablet   Other Relevant Orders   Comprehensive metabolic panel   CBC With Diff/Platelet     Endocrine   Type 2 diabetes mellitus without complication, without long-term current use of insulin (HCC) - Primary   Relevant Medications   metFORMIN (GLUCOPHAGE-XR) 750 MG 24 hr tablet   atorvastatin (LIPITOR) 10 MG tablet   FARXIGA 10 MG TABS tablet   pioglitazone (ACTOS) 30 MG tablet   Other Relevant Orders   POCT CBG (Fasting - Glucose) (Completed)   Hemoglobin A1c   POC CREATINE & ALBUMIN,URINE     Other   Pure hypercholesterolemia   Relevant Medications   atorvastatin (LIPITOR) 10 MG tablet   Other Relevant Orders   Lipid panel   Other Visit Diagnoses     Benign prostatic hyperplasia without lower urinary tract symptoms       Relevant Orders   PSA       Return in about 3 months (around 01/09/2023) for awv with labs prior.   Total time spent: 20 minutes  Luna Fuse, MD  10/09/2022   This document may have been prepared by [facility] Voice Recognition software and as such may include unintentional dictation errors.

## 2022-10-16 ENCOUNTER — Other Ambulatory Visit: Payer: Self-pay | Admitting: Internal Medicine

## 2022-10-16 DIAGNOSIS — E7800 Pure hypercholesterolemia, unspecified: Secondary | ICD-10-CM

## 2022-10-16 DIAGNOSIS — E119 Type 2 diabetes mellitus without complications: Secondary | ICD-10-CM

## 2022-10-19 NOTE — Progress Notes (Signed)
Sent message, via epic in basket, requesting orders in epic from surgeon.  

## 2022-10-23 ENCOUNTER — Ambulatory Visit: Payer: Self-pay | Admitting: Emergency Medicine

## 2022-10-23 DIAGNOSIS — T8482XA Fibrosis due to internal orthopedic prosthetic devices, implants and grafts, initial encounter: Secondary | ICD-10-CM

## 2022-10-23 DIAGNOSIS — G8929 Other chronic pain: Secondary | ICD-10-CM

## 2022-10-23 NOTE — H&P (Signed)
TOTAL KNEE REVISION ADMISSION H&P  Patient is being admitted for right revision total knee arthroplasty.  Subjective:  Chief Complaint:right knee pain and stiffness.  HPI: Steven Buck, 72 y.o. male, has a history of pain and functional disability in the right knee(s) due to  failed previous total knee arthroplasty component  and patient has failed non-surgical conservative treatments for greater than 12 weeks to include NSAID's and/or analgesics and activity modification. The indications for the revision of the total knee arthroplasty are  worsening post-operative stiffness and declining range of motion . Onset of symptoms was gradual starting  after total knee replacement in 2022  with gradually worsening course since that time.  Prior procedures on the right knee(s) include arthroplasty.  Patient currently rates pain in the right knee(s) at 10 out of 10 with activity. There is night pain, worsening of pain with activity and weight bearing, pain that interferes with activities of daily living, and post-operative stiffness .  Patient without evidence of hardware loosening or periprosthetic fracture by imaging studies. This condition presents safety issues increasing the risk of falls.  There is no current active infection.  Patient Active Problem List   Diagnosis Date Noted   Type 2 diabetes mellitus without complication, without long-term current use of insulin (HCC) 10/09/2022   Pure hypercholesterolemia 10/09/2022   Primary hypertension 10/09/2022   Past Medical History:  Diagnosis Date   Diabetes mellitus without complication [facility])     Past Surgical History:  Procedure Laterality Date   JOINT REPLACEMENT     REPLACEMENT TOTAL KNEE Right 2022    Current Outpatient Medications  Medication Sig Dispense Refill Last Dose   aspirin EC 81 MG tablet Take by mouth.      atorvastatin (LIPITOR) 10 MG tablet Take 1 tablet (10 mg total) by mouth daily. 90 tablet 0    FARXIGA 10 MG TABS tablet  Take 1 tablet (10 mg total) by mouth daily. 30 tablet 2    metFORMIN (GLUCOPHAGE-XR) 750 MG 24 hr tablet Take 1 tablet (750 mg total) by mouth daily. 90 tablet 0    pioglitazone (ACTOS) 30 MG tablet Take 1 tablet (30 mg total) by mouth daily. 90 tablet 0    No current facility-administered medications for this visit.   No Known Allergies  Social History   Tobacco Use   Smoking status: Never   Smokeless tobacco: Never  Substance Use Topics   Alcohol use: Yes    No family history on file.    Review of Systems  Musculoskeletal:  Positive for arthralgias.  All other systems reviewed and are negative.    Objective:  Physical Exam Constitutional:      General: He is not in acute distress.    Appearance: Normal appearance. He is normal weight.  HENT:     Head: Normocephalic and atraumatic.  Eyes:     Extraocular Movements: Extraocular movements intact.     Conjunctiva/sclera: Conjunctivae normal.     Pupils: Pupils are equal, round, and reactive to light.  Cardiovascular:     Rate and Rhythm: Normal rate and regular rhythm.     Pulses: Normal pulses.     Heart sounds: Normal heart sounds.  Pulmonary:     Effort: Pulmonary effort is normal. No respiratory distress.     Breath sounds: Normal breath sounds.  Abdominal:     General: Bowel sounds are normal. There is no distension.     Palpations: Abdomen is soft.     Tenderness:  There is no abdominal tenderness.  Musculoskeletal:        General: Tenderness present.     Cervical back: Normal range of motion and neck supple.     Comments: Mild swelling and tenderness of right knee.  No calf pain, swelling, or erythema.  Mildly antalgic gait.  ROM 20-80 degrees (pre-operative 15-90 degrees).  Hip without groin pain.  BLE appear grossly neurovascularly intact.    Lymphadenopathy:     Cervical: No cervical adenopathy.  Skin:    General: Skin is warm and dry.     Capillary Refill: Capillary refill takes less than 2 seconds.      Findings: No erythema or rash.  Neurological:     General: No focal deficit present.     Mental Status: He is alert and oriented to person, place, and time.  Psychiatric:        Mood and Affect: Mood normal.        Behavior: Behavior normal.     Vital signs in last 24 hours: @VSRANGES @  Labs:  Estimated body mass index is 26.12 kg/m as calculated from the following:   Height as of 10/09/22: 6' (1.829 m).   Weight as of 10/09/22: 87.4 kg.  Imaging Review Plain radiographs demonstrate total knee arthroplasty components in good position without adverse features.  There is no evidence of loosening of the femoral and tibial components. The bone quality appears to be good for age and reported activity level.    Assessment/Plan:  End stage arthritis, right knee(s) with failed previous arthroplasty   The patient history, physical examination, clinical judgment of the provider and imaging studies are consistent with post-operative arthrofibrosis and stiffness of the right knee(s), previous total knee arthroplasty. Liner exchange and arthrofibrosis excision is deemed medically necessary. The treatment options including medical management, injection therapy, arthroscopy and revision arthroplasty were discussed at length. The risks and benefits were presented and reviewed. The risks due to aseptic loosening, infection, stiffness, patella tracking problems, thromboembolic complications and other imponderables were discussed. The patient acknowledged the explanation, agreed to proceed with the plan and consent was signed. Patient is being admitted for inpatient treatment for surgery, pain control, PT, OT, prophylactic antibiotics, VTE prophylaxis, progressive ambulation and ADL's and discharge planning.The patient is planning to be discharged home with outpatient PT.

## 2022-10-23 NOTE — H&P (View-Only) (Signed)
TOTAL KNEE REVISION ADMISSION H&P  Patient is being admitted for right revision total knee arthroplasty.  Subjective:  Chief Complaint:right knee pain and stiffness.  HPI: Steven Buck, 72 y.o. male, has a history of pain and functional disability in the right knee(s) due to  failed previous total knee arthroplasty component  and patient has failed non-surgical conservative treatments for greater than 12 weeks to include NSAID's and/or analgesics and activity modification. The indications for the revision of the total knee arthroplasty are  worsening post-operative stiffness and declining range of motion . Onset of symptoms was gradual starting  after total knee replacement in 2022  with gradually worsening course since that time.  Prior procedures on the right knee(s) include arthroplasty.  Patient currently rates pain in the right knee(s) at 10 out of 10 with activity. There is night pain, worsening of pain with activity and weight bearing, pain that interferes with activities of daily living, and post-operative stiffness .  Patient without evidence of hardware loosening or periprosthetic fracture by imaging studies. This condition presents safety issues increasing the risk of falls.  There is no current active infection.  Patient Active Problem List   Diagnosis Date Noted   Type 2 diabetes mellitus without complication, without long-term current use of insulin (HCC) 10/09/2022   Pure hypercholesterolemia 10/09/2022   Primary hypertension 10/09/2022   Past Medical History:  Diagnosis Date   Diabetes mellitus without complication (HCC)     Past Surgical History:  Procedure Laterality Date   JOINT REPLACEMENT     REPLACEMENT TOTAL KNEE Right 2022    Current Outpatient Medications  Medication Sig Dispense Refill Last Dose   aspirin EC 81 MG tablet Take by mouth.      atorvastatin (LIPITOR) 10 MG tablet Take 1 tablet (10 mg total) by mouth daily. 90 tablet 0    FARXIGA 10 MG TABS tablet  Take 1 tablet (10 mg total) by mouth daily. 30 tablet 2    metFORMIN (GLUCOPHAGE-XR) 750 MG 24 hr tablet Take 1 tablet (750 mg total) by mouth daily. 90 tablet 0    pioglitazone (ACTOS) 30 MG tablet Take 1 tablet (30 mg total) by mouth daily. 90 tablet 0    No current facility-administered medications for this visit.   No Known Allergies  Social History   Tobacco Use   Smoking status: Never   Smokeless tobacco: Never  Substance Use Topics   Alcohol use: Yes    No family history on file.    Review of Systems  Musculoskeletal:  Positive for arthralgias.  All other systems reviewed and are negative.    Objective:  Physical Exam Constitutional:      General: He is not in acute distress.    Appearance: Normal appearance. He is normal weight.  HENT:     Head: Normocephalic and atraumatic.  Eyes:     Extraocular Movements: Extraocular movements intact.     Conjunctiva/sclera: Conjunctivae normal.     Pupils: Pupils are equal, round, and reactive to light.  Cardiovascular:     Rate and Rhythm: Normal rate and regular rhythm.     Pulses: Normal pulses.     Heart sounds: Normal heart sounds.  Pulmonary:     Effort: Pulmonary effort is normal. No respiratory distress.     Breath sounds: Normal breath sounds.  Abdominal:     General: Bowel sounds are normal. There is no distension.     Palpations: Abdomen is soft.     Tenderness:   There is no abdominal tenderness.  Musculoskeletal:        General: Tenderness present.     Cervical back: Normal range of motion and neck supple.     Comments: Mild swelling and tenderness of right knee.  No calf pain, swelling, or erythema.  Mildly antalgic gait.  ROM 20-80 degrees (pre-operative 15-90 degrees).  Hip without groin pain.  BLE appear grossly neurovascularly intact.    Lymphadenopathy:     Cervical: No cervical adenopathy.  Skin:    General: Skin is warm and dry.     Capillary Refill: Capillary refill takes less than 2 seconds.      Findings: No erythema or rash.  Neurological:     General: No focal deficit present.     Mental Status: He is alert and oriented to person, place, and time.  Psychiatric:        Mood and Affect: Mood normal.        Behavior: Behavior normal.     Vital signs in last 24 hours: @VSRANGES@  Labs:  Estimated body mass index is 26.12 kg/m as calculated from the following:   Height as of 10/09/22: 6' (1.829 m).   Weight as of 10/09/22: 87.4 kg.  Imaging Review Plain radiographs demonstrate total knee arthroplasty components in good position without adverse features.  There is no evidence of loosening of the femoral and tibial components. The bone quality appears to be good for age and reported activity level.    Assessment/Plan:  End stage arthritis, right knee(s) with failed previous arthroplasty   The patient history, physical examination, clinical judgment of the provider and imaging studies are consistent with post-operative arthrofibrosis and stiffness of the right knee(s), previous total knee arthroplasty. Liner exchange and arthrofibrosis excision is deemed medically necessary. The treatment options including medical management, injection therapy, arthroscopy and revision arthroplasty were discussed at length. The risks and benefits were presented and reviewed. The risks due to aseptic loosening, infection, stiffness, patella tracking problems, thromboembolic complications and other imponderables were discussed. The patient acknowledged the explanation, agreed to proceed with the plan and consent was signed. Patient is being admitted for inpatient treatment for surgery, pain control, PT, OT, prophylactic antibiotics, VTE prophylaxis, progressive ambulation and ADL's and discharge planning.The patient is planning to be discharged home with outpatient PT.   

## 2022-10-24 NOTE — Progress Notes (Signed)
COVID Vaccine received:  []  No [x]  Yes Date of any COVID positive Test in last 90 days:  PCP - s. Gillermo Murdoch MD Cardiologist -   Chest x-ray -  EKG -   Stress Test -  ECHO -  Cardiac Cath -   Bowel Prep - []  No  []   Yes ______  Pacemaker / ICD device []  No []  Yes   Spinal Cord Stimulator:[]  No []  Yes       History of Sleep Apnea? []  No []  Yes   CPAP used?- []  No []  Yes    Does the patient monitor blood sugar?          []  No []  Yes  []  N/A  Patient has: []  NO Hx DM   []  Pre-DM                 []  DM1  []   DM2 Does patient have a [employer] or [employer]? []  No []  Yes   Fasting Blood Sugar Ranges-  Checks Blood Sugar _____ times a day  GLP1 agonist / usual dose -  GLP1 instructions:  SGLT-2 inhibitors / usual dose -  SGLT-2 instructions:   Blood Thinner / Instructions: Aspirin Instructions:  Comments:   Activity level: Patient is able / unable to climb a flight of stairs without difficulty; []  No CP  []  No SOB, but would have ___   Patient can / can not perform ADLs without assistance.   Anesthesia review:   Patient denies shortness of breath, fever, cough and chest pain at PAT appointment.  Patient verbalized understanding and agreement to the Pre-Surgical Instructions that were given to them at this PAT appointment. Patient was also educated of the need to review these PAT instructions again prior to his/her surgery.I reviewed the appropriate phone numbers to call if they have any and questions or concerns.

## 2022-10-24 NOTE — Patient Instructions (Addendum)
SURGICAL WAITING ROOM VISITATION Patients having surgery or a procedure may have no more than 2 support people in the waiting area - these visitors may rotate in the visitor waiting room.   Due to an increase in RSV and influenza rates and associated hospitalizations, children ages 70 and under may not visit patients in [facility] hospitals. If the patient needs to stay at the hospital during part of their recovery, the visitor guidelines for inpatient rooms apply.  PRE-OP VISITATION  Pre-op nurse will coordinate an appropriate time for 1 support person to accompany the patient in pre-op.  This support person may not rotate.  This visitor will be contacted when the time is appropriate for the visitor to come back in the pre-op area.  Please refer to the [facility] website for the visitor guidelines for Inpatients (after your surgery is over and you are in a regular room).  You are not required to quarantine at this time prior to your surgery. However, you must do this: Hand Hygiene often Do NOT share personal items Notify your provider if you are in close contact with someone who has COVID or you develop fever 100.4 or greater, new onset of sneezing, cough, sore throat, shortness of breath or body aches.  If you test positive for Covid or have been in contact with anyone that has tested positive in the last 10 days please notify you surgeon.    Your procedure is scheduled on:  Monday  November 05, 2022  Report to [facility] Main Entrance: [place] entrance where the [employer] is available.   Report to admitting at: 05:15    AM  Call this number if you have any questions or problems the morning of surgery [phone]  Do not eat food after Midnight the night prior to your surgery/procedure.  After Midnight you may have the following liquids until  04:30   AM  DAY OF SURGERY  Clear Liquid Diet Water Black Coffee (sugar ok, NO MILK/CREAM OR CREAMERS)  Tea (sugar ok, NO  MILK/CREAM OR CREAMERS) regular and decaf                             Plain Jell-O  with no fruit (NO RED)                                           Fruit ices (not with fruit pulp, NO RED)                                     Popsicles (NO RED)                                                                  Juice: NO CITRUS JUICES: only apple, WHITE grape, WHITE cranberry Sports drinks like Gatorade or Powerade (NO RED)                     The day of surgery:  Drink ONE (1) Pre-Surgery G2 at  04:30  AM  the morning of surgery. Drink in one sitting. Do not sip.  This drink was given to you during your hospital pre-op appointment visit. Nothing else to drink after completing the Pre-Surgery  G2 : No candy, chewing gum or throat lozenges.    FOLLOW  ANY ADDITIONAL PRE OP INSTRUCTIONS YOU RECEIVED FROM YOUR SURGEON'S OFFICE!!!   Oral Hygiene is also important to reduce your risk of infection.        Remember - BRUSH YOUR TEETH THE MORNING OF SURGERY WITH YOUR REGULAR TOOTHPASTE  Do NOT smoke after Midnight the night before surgery.  DIABETIC MEDICATION:  FARXIGA:   Hold this medicine x 72 hours prior to your surgery;  Last Dose will be taken on Thursday  11-01-2022 METFORMIN:  you may take this the DAY BEFORE your surgery as usual but DO NOT TAKE Metformin on THE DAY OF YOUR SURGERY. Pioglitazone (ACTOS):  you may take this the DAY BEFORE your surgery as usual but DO NOT TAKE Metformin on THE DAY OF YOUR SURGERY.   Take ONLY these medicines the morning of surgery with A SIP OF WATER: None   You may not have any metal on your body including  jewelry, and body piercing  Do not wear  lotions, powders, cologne, or deodorant  Men may shave face and neck.   Patients discharged on the day of surgery will not be allowed to drive home.  Someone NEEDS to stay with you for the first 24 hours after anesthesia.  Do not bring your home medications to the hospital. The Pharmacy will dispense  medications listed on your medication list to you during your admission in the Hospital.  Please read over the following fact sheets you were given: IF YOU HAVE QUESTIONS ABOUT YOUR PRE-OP INSTRUCTIONS, PLEASE CALL [phone].        Pre-operative 5 CHG Bath Instructions   You can play a key role in reducing the risk of infection after surgery. Your skin needs to be as free of germs as possible. You can reduce the number of germs on your skin by washing with CHG (chlorhexidine gluconate) soap before surgery. CHG is an antiseptic soap that kills germs and continues to kill germs even after washing.   DO NOT use if you have an allergy to chlorhexidine/CHG or antibacterial soaps. If your skin becomes reddened or irritated, stop using the CHG and notify one of our RNs at [phone]   Please shower with the CHG soap starting 4 days before surgery using the following schedule: START SHOWERS ON THURSDAY  November 01, 2022  Please keep in mind the following:  DO NOT shave, including legs and underarms, starting the day of your first shower.   You may shave your face at any point before/day of surgery.   Place clean sheets on your bed the day you start using CHG soap. Use a clean washcloth (not used since being washed) for each shower. DO NOT sleep with pets once you start using the CHG.   CHG Shower Instructions:  If you choose to wash your hair and private area, wash first with your normal shampoo/soap.  After you use shampoo/soap, rinse your hair and body thoroughly to remove shampoo/soap residue.  Turn the water OFF and apply about 3 tablespoons (45 ml) of CHG soap to a CLEAN washcloth.  Apply CHG soap ONLY FROM YOUR NECK DOWN TO YOUR TOES (washing for 3-5 minutes)  DO NOT use CHG soap on face, private areas,  open wounds, or sores.  Pay special attention to the area where your surgery is being performed.  If you are having back surgery, having someone wash your back for you may be helpful.  Wait 2 minutes after CHG soap is applied, then you may rinse off the CHG soap.  Pat dry with a clean towel  Put on clean clothes/pajamas   If you choose to wear lotion, please use ONLY the CHG-compatible lotions on the back of this paper.     Additional instructions for the day of surgery: DO NOT APPLY any lotions, deodorants, cologne, or perfumes.   Put on clean/comfortable clothes.  Brush your teeth.  Ask your nurse before applying any prescription medications to the skin.         CHG Compatible Lotions   Aveeno Moisturizing lotion  Cetaphil Moisturizing Cream  Cetaphil Moisturizing Lotion  Clairol Herbal Essence Moisturizing Lotion, Dry Skin  Clairol Herbal Essence Moisturizing Lotion, Extra Dry Skin  Clairol Herbal Essence Moisturizing Lotion, Normal Skin  Curel Age Defying Therapeutic Moisturizing Lotion with Alpha Hydroxy  Curel Extreme Care Body Lotion  Curel Soothing Hands Moisturizing Hand Lotion  Curel Therapeutic Moisturizing Cream, Fragrance-Free  Curel Therapeutic Moisturizing Lotion, Fragrance-Free  Curel Therapeutic Moisturizing Lotion, Original Formula  Eucerin Daily Replenishing Lotion  Eucerin Dry Skin Therapy Plus Alpha Hydroxy Crme  Eucerin Dry Skin Therapy Plus Alpha Hydroxy Lotion  Eucerin Original Crme  Eucerin Original Lotion  Eucerin Plus Crme Eucerin Plus Lotion  Eucerin TriLipid Replenishing Lotion  Keri Anti-Bacterial Hand Lotion  Keri Deep Conditioning Original Lotion Dry Skin Formula Softly Scented  Keri Deep Conditioning Original Lotion, Fragrance Free Sensitive Skin Formula  Keri Lotion Fast Absorbing Fragrance Free Sensitive Skin Formula  Keri Lotion Fast Absorbing Softly Scented Dry Skin Formula  Keri Original Lotion  Keri Skin Renewal Lotion Keri  Silky Smooth Lotion  Keri Silky Smooth Sensitive Skin Lotion  Nivea Body Creamy Conditioning Oil  Nivea Body Extra Enriched Lotion  Nivea Body Original Lotion  Nivea Body Sheer Moisturizing Lotion Nivea Crme  Nivea Skin Firming Lotion  NutraDerm 30 Skin Lotion  NutraDerm Skin Lotion  NutraDerm Therapeutic Skin Cream  NutraDerm Therapeutic Skin Lotion  ProShield Protective Hand Cream  Provon moisturizing lotion   FAILURE TO FOLLOW THESE INSTRUCTIONS MAY RESULT IN THE CANCELLATION OF YOUR SURGERY  PATIENT SIGNATURE_________________________________  NURSE SIGNATURE__________________________________  ________________________________________________________________________        Steven Buck    An incentive spirometer is a tool that can help keep your lungs clear and active. This tool measures how well you are filling your lungs  with each breath. Taking long deep breaths may help reverse or decrease the chance of developing breathing (pulmonary) problems (especially infection) following: A long period of time when you are unable to move or be active. BEFORE THE PROCEDURE  If the spirometer includes an indicator to show your best effort, your nurse or respiratory therapist will set it to a desired goal. If possible, sit up straight or lean slightly forward. Try not to slouch. Hold the incentive spirometer in an upright position. INSTRUCTIONS FOR USE  Sit on the edge of your bed if possible, or sit up as far as you can in bed or on a chair. Hold the incentive spirometer in an upright position. Breathe out normally. Place the mouthpiece in your mouth and seal your lips tightly around it. Breathe in slowly and as deeply as possible, raising the piston or the ball toward the top of the column. Hold your breath for 3-5 seconds or for as long as possible. Allow the piston or ball to fall to the bottom of the column. Remove the mouthpiece from your mouth and breathe out  normally. Rest for a few seconds and repeat Steps 1 through 7 at least 10 times every 1-2 hours when you are awake. Take your time and take a few normal breaths between deep breaths. The spirometer may include an indicator to show your best effort. Use the indicator as a goal to work toward during each repetition. After each set of 10 deep breaths, practice coughing to be sure your lungs are clear. If you have an incision (the cut made at the time of surgery), support your incision when coughing by placing a pillow or rolled up towels firmly against it. Once you are able to get out of bed, walk around indoors and cough well. You may stop using the incentive spirometer when instructed by your caregiver.  RISKS AND COMPLICATIONS Take your time so you do not get dizzy or light-headed. If you are in pain, you may need to take or ask for pain medication before doing incentive spirometry. It is harder to take a deep breath if you are having pain. AFTER USE Rest and breathe slowly and easily. It can be helpful to keep track of a log of your progress. Your caregiver can provide you with a simple table to help with this. If you are using the spirometer at home, follow these instructions: SEEK MEDICAL CARE IF:  You are having difficultly using the spirometer. You have trouble using the spirometer as often as instructed. Your pain medication is not giving enough relief while using the spirometer. You develop fever of 100.5 F (38.1 C) or higher.                                                                                                    SEEK IMMEDIATE MEDICAL CARE IF:  You cough up bloody sputum that had not been present before. You develop fever of 102 F (38.9 C) or greater. You develop worsening pain at or near the incision site. MAKE SURE YOU:  Understand these instructions. Will  watch your condition. Will get help right away if you are not doing well or get worse. Document Released:  08/27/2006 Document Revised: 07/09/2011 Document Reviewed: 10/28/2006 [facility] Patient Information 2014 [place], [place].      WHAT IS A BLOOD TRANSFUSION? Blood Transfusion Information  A transfusion is the replacement of blood or some of its parts. Blood is made up of multiple cells which provide different functions. Red blood cells carry oxygen and are used for blood loss replacement. White blood cells fight against infection. Platelets control bleeding. Plasma helps clot blood. Other blood products are available for specialized needs, such as hemophilia or other clotting disorders. BEFORE THE TRANSFUSION  Who gives blood for transfusions?  Healthy volunteers who are fully evaluated to make sure their blood is safe. This is blood bank blood. Transfusion therapy is the safest it has ever been in the practice of medicine. Before blood is taken from a donor, a complete history is taken to make sure that person has no history of diseases nor engages in risky social behavior (examples are intravenous drug use or sexual activity with multiple partners). The donor's travel history is screened to minimize risk of transmitting infections, such as malaria. The donated blood is tested for signs of infectious diseases, such as HIV and hepatitis. The blood is then tested to be sure it is compatible with you in order to minimize the chance of a transfusion reaction. If you or a relative donates blood, this is often done in anticipation of surgery and is not appropriate for emergency situations. It takes many days to process the donated blood. RISKS AND COMPLICATIONS Although transfusion therapy is very safe and saves many lives, the main dangers of transfusion include:  Getting an infectious disease. Developing a transfusion reaction. This is an allergic reaction to something in the blood you were given. Every precaution is taken to prevent this. The decision to have a blood transfusion has been  considered carefully by your caregiver before blood is given. Blood is not given unless the benefits outweigh the risks. AFTER THE TRANSFUSION Right after receiving a blood transfusion, you will usually feel much better and more energetic. This is especially true if your red blood cells have gotten low (anemic). The transfusion raises the level of the red blood cells which carry oxygen, and this usually causes an energy increase. The nurse administering the transfusion will monitor you carefully for complications. HOME CARE INSTRUCTIONS  No special instructions are needed after a transfusion. You may find your energy is better. Speak with your caregiver about any limitations on activity for underlying diseases you may have. SEEK MEDICAL CARE IF:  Your condition is not improving after your transfusion. You develop redness or irritation at the intravenous (IV) site. SEEK IMMEDIATE MEDICAL CARE IF:  Any of the following symptoms occur over the next 12 hours: Shaking chills. You have a temperature by mouth above 102 F (38.9 C), not controlled by medicine. Chest, back, or muscle pain. People around you feel you are not acting correctly or are confused. Shortness of breath or difficulty breathing. Dizziness and fainting. You get a rash or develop hives. You have a decrease in urine output. Your urine turns a dark color or changes to pink, red, or brown. Any of the following symptoms occur over the next 10 days: You have a temperature by mouth above 102 F (38.9 C), not controlled by medicine. Shortness of breath. Weakness after normal activity. The white part of the eye turns yellow (jaundice).  You have a decrease in the amount of urine or are urinating less often. Your urine turns a dark color or changes to pink, red, or brown. Document Released: 04/13/2000 Document Revised: 07/09/2011 Document Reviewed: 12/01/2007 [facility] Patient Information 2014 [place], [place].          _______________________________________________________________________

## 2022-10-25 NOTE — Progress Notes (Addendum)
COVID Vaccine received:  []  No [x]  Yes Date of any COVID positive Test in last 24 days:None  PCP - S. Gillermo Murdoch, MD  at [facility]   Cardiologist - None  Chest x-ray - 10-21-2020  2v  Epic EKG -  (10-21-2020  Epic)  will repeat at PST Stress Test -  ECHO -  Cardiac Cath -   PCR screen: [x]  Ordered & Completed           []   No Order but Needs PROFEND           []   N/A for this surgery  Surgery Plan:  [x]  Ambulatory                            []  Outpatient in bed                            []  Admit  Anesthesia:    []  General  [x]  Spinal                           []   Choice []   MAC  Pacemaker / ICD device [x]  No []  Yes   Spinal Cord Stimulator:[x]  No []  Yes       History of Sleep Apnea? [x]  No []  Yes   CPAP used?- [x]  No []  Yes    Does the patient monitor blood sugar?          [x]  No []  Yes  []  N/A Last A1c on 10-05-2022 was 6.4 Patient has: []  NO Hx DM   []  Pre-DM                 []  DM1  [x]   DM2 Does patient have a [employer] or Dexacom? [x]  No []  Yes   Fasting Blood Sugar Ranges-  Checks Blood Sugar __0 times a day  SGLT-2 inhibitors / usual dose - Farxiga 10mg  daily  SGLT-2 instructions: Hold x 72 hours.  Last dose: Thursday  11-01-2022  Other Diabetic medications/ instructions:  Metformin 750mg   1 tablet daily-   hold DOS Pioglitazone (Actos) 30 mg  1 tablet- Hold DOS  Blood Thinner / Instructions:   None Aspirin Instructions:  ASA 81 mg  Hold x 7 days,  patient is aware, Last Dose: 10-28-22  ERAS Protocol Ordered: []  No  [x]  Yes PRE-SURGERY []  ENSURE  [x]  G2  Patient is to be NPO after: 04:30   Comments: Patient was given the 5 CHG shower / bath instructions for TKA revision  surgery along with 2 bottles of the CHG soap. Patient will start this on:    Thursday 11-01-2022   All questions were asked and answered, Patient voiced understanding of this process.   Activity level: Patient is unable to climb a flight of stairs without difficulty; [x]  No CP   [x]  No SOB, but would have _leg pain  Patient can not perform ADLs without assistance.   Anesthesia review: DM2, HTN, HLD  Patient denies shortness of breath, fever, cough and chest pain at PAT appointment.  Patient verbalized understanding and agreement to the Pre-Surgical Instructions that were given to them at this PAT appointment. Patient was also educated of the need to review these PAT instructions again prior to his surgery.I reviewed the appropriate phone numbers to call if they have any and questions or concerns.

## 2022-10-26 ENCOUNTER — Encounter (HOSPITAL_COMMUNITY): Payer: Self-pay

## 2022-10-26 ENCOUNTER — Other Ambulatory Visit: Payer: Self-pay

## 2022-10-26 ENCOUNTER — Encounter (HOSPITAL_COMMUNITY)
Admission: RE | Admit: 2022-10-26 | Discharge: 2022-10-26 | Disposition: A | Discharge status: Home / Self Care | Payer: BC Managed Care – PPO | Source: Ambulatory Visit | Attending: Orthopedic Surgery | Admitting: Orthopedic Surgery

## 2022-10-26 VITALS — BP 112/73 | HR 64 | Temp 97.7°F | Resp 16 | Ht 72.0 in | Wt 187.0 lb

## 2022-10-26 DIAGNOSIS — G8929 Other chronic pain: Secondary | ICD-10-CM | POA: Insufficient documentation

## 2022-10-26 DIAGNOSIS — T8482XA Fibrosis due to internal orthopedic prosthetic devices, implants and grafts, initial encounter: Secondary | ICD-10-CM | POA: Insufficient documentation

## 2022-10-26 DIAGNOSIS — M25561 Pain in right knee: Secondary | ICD-10-CM | POA: Insufficient documentation

## 2022-10-26 DIAGNOSIS — Y838 Other surgical procedures as the cause of abnormal reaction of the patient, or of later complication, without mention of misadventure at the time of the procedure: Secondary | ICD-10-CM | POA: Diagnosis not present

## 2022-10-26 DIAGNOSIS — Y798 Miscellaneous orthopedic devices associated with adverse incidents, not elsewhere classified: Secondary | ICD-10-CM | POA: Diagnosis not present

## 2022-10-26 DIAGNOSIS — E119 Type 2 diabetes mellitus without complications: Secondary | ICD-10-CM | POA: Diagnosis not present

## 2022-10-26 DIAGNOSIS — Z01818 Encounter for other preprocedural examination: Secondary | ICD-10-CM | POA: Diagnosis present

## 2022-10-26 HISTORY — DX: Unspecified osteoarthritis, unspecified site: M19.90

## 2022-10-26 HISTORY — DX: Essential (primary) hypertension: I10

## 2022-10-26 LAB — COMPREHENSIVE METABOLIC PANEL
ALT: 22 U/L (ref 0–44)
AST: 33 U/L (ref 15–41)
Albumin: 4 g/dL (ref 3.5–5.0)
Alkaline Phosphatase: 51 U/L (ref 38–126)
Anion gap: 7 (ref 5–15)
BUN: 22 mg/dL (ref 8–23)
CO2: 25 mmol/L (ref 22–32)
Calcium: 8.9 mg/dL (ref 8.9–10.3)
Chloride: 104 mmol/L (ref 98–111)
Creatinine, Ser: 1.2 mg/dL (ref 0.61–1.24)
GFR, Estimated: >60 mL/min (ref >60)
Glucose, Bld: 94 mg/dL (ref 70–99)
Potassium: 4.2 mmol/L (ref 3.5–5.1)
Sodium: 136 mmol/L (ref 135–145)
Total Bilirubin: 1.2 mg/dL (ref 0.3–1.2)
Total Protein: 7.7 g/dL (ref 6.5–8.1)

## 2022-10-26 LAB — CBC WITH DIFFERENTIAL/PLATELET
Abs Immature Granulocytes: 0.03 10*3/uL (ref 0.00–0.07)
Basophils Absolute: 0.1 10*3/uL (ref 0.0–0.1)
Basophils Relative: 2 %
Eosinophils Absolute: 0.1 10*3/uL (ref 0.0–0.5)
Eosinophils Relative: 2 %
HCT: 44.2 % (ref 39.0–52.0)
Hemoglobin: 14.6 g/dL (ref 13.0–17.0)
Immature Granulocytes: 1 %
Lymphocytes Relative: 30 %
Lymphs Abs: 1.8 10*3/uL (ref 0.7–4.0)
MCH: 29.9 pg (ref 26.0–34.0)
MCHC: 33 g/dL (ref 30.0–36.0)
MCV: 90.4 fL (ref 80.0–100.0)
Monocytes Absolute: 0.6 10*3/uL (ref 0.1–1.0)
Monocytes Relative: 10 %
Neutro Abs: 3.3 10*3/uL (ref 1.7–7.7)
Neutrophils Relative %: 55 %
Platelets: 311 10*3/uL (ref 150–400)
RBC: 4.89 MIL/uL (ref 4.22–5.81)
RDW: 13.2 % (ref 11.5–15.5)
WBC: 5.9 10*3/uL (ref 4.0–10.5)
nRBC: 0 % (ref 0.0–0.2)

## 2022-10-26 LAB — SURGICAL PCR SCREEN
MRSA, PCR: NEGATIVE
Staphylococcus aureus: NEGATIVE

## 2022-10-26 LAB — GLUCOSE, CAPILLARY: Glucose-Capillary: 104 mg/dL — ABNORMAL HIGH (ref 70–99)

## 2022-11-04 NOTE — Anesthesia Preprocedure Evaluation (Signed)
Anesthesia Evaluation  Patient identified by MRN, date of birth, ID band Patient awake    Reviewed: Allergy & Precautions, NPO status , Patient's Chart, lab work & pertinent test results  Airway Mallampati: II  TM Distance: >3 FB Neck ROM: Full    Dental no notable dental hx.    Pulmonary neg pulmonary ROS   Pulmonary exam normal        Cardiovascular hypertension, Normal cardiovascular exam Rate:Normal     Neuro/Psych negative neurological ROS  negative psych ROS   GI/Hepatic negative GI ROS, Neg liver ROS,,,  Endo/Other  diabetes, Oral Hypoglycemic Agents    Renal/GU negative Renal ROS     Musculoskeletal  (+) Arthritis ,    Abdominal   Peds  Hematology negative hematology ROS (+)   Anesthesia Other Findings FAILED RIGHT KNEE PROSTHESIS  Reproductive/Obstetrics                             Anesthesia Physical Anesthesia Plan  ASA: 2  Anesthesia Plan: Spinal and Regional   Post-op Pain Management: Regional block*   Induction: Intravenous  PONV Risk Score and Plan: 1 and Ondansetron, Dexamethasone, Propofol infusion, Midazolam and Treatment may vary due to age or medical condition  Airway Management Planned: Simple Face Mask  Additional Equipment:   Intra-op Plan:   Post-operative Plan:   Informed Consent: I have reviewed the patients History and Physical, chart, labs and discussed the procedure including the risks, benefits and alternatives for the proposed anesthesia with the patient or authorized representative who has indicated his/her understanding and acceptance.     Dental advisory given  Plan Discussed with: CRNA  Anesthesia Plan Comments:        Anesthesia Quick Evaluation

## 2022-11-05 ENCOUNTER — Observation Stay (HOSPITAL_COMMUNITY)
Admission: RE | Admit: 2022-11-05 | Discharge: 2022-11-06 | Disposition: A | Discharge status: Home / Self Care | Payer: BC Managed Care – PPO | Attending: Orthopedic Surgery | Admitting: Orthopedic Surgery

## 2022-11-05 ENCOUNTER — Ambulatory Visit (HOSPITAL_COMMUNITY): Payer: BC Managed Care – PPO

## 2022-11-05 ENCOUNTER — Encounter (HOSPITAL_COMMUNITY): Payer: Self-pay | Admitting: Orthopedic Surgery

## 2022-11-05 ENCOUNTER — Other Ambulatory Visit: Payer: Self-pay

## 2022-11-05 ENCOUNTER — Ambulatory Visit (HOSPITAL_COMMUNITY): Payer: BC Managed Care – PPO | Admitting: Anesthesiology

## 2022-11-05 ENCOUNTER — Encounter (HOSPITAL_COMMUNITY)
Admission: RE | Disposition: A | Discharge status: Home / Self Care | Payer: Self-pay | Source: Home / Self Care | Attending: Orthopedic Surgery

## 2022-11-05 DIAGNOSIS — Z7982 Long term (current) use of aspirin: Secondary | ICD-10-CM | POA: Insufficient documentation

## 2022-11-05 DIAGNOSIS — I10 Essential (primary) hypertension: Secondary | ICD-10-CM | POA: Diagnosis not present

## 2022-11-05 DIAGNOSIS — M24661 Ankylosis, right knee: Secondary | ICD-10-CM | POA: Diagnosis not present

## 2022-11-05 DIAGNOSIS — Z79899 Other long term (current) drug therapy: Secondary | ICD-10-CM | POA: Insufficient documentation

## 2022-11-05 DIAGNOSIS — Z96651 Presence of right artificial knee joint: Secondary | ICD-10-CM | POA: Insufficient documentation

## 2022-11-05 DIAGNOSIS — Z7984 Long term (current) use of oral hypoglycemic drugs: Secondary | ICD-10-CM | POA: Diagnosis not present

## 2022-11-05 DIAGNOSIS — E119 Type 2 diabetes mellitus without complications: Secondary | ICD-10-CM | POA: Insufficient documentation

## 2022-11-05 DIAGNOSIS — Z96659 Presence of unspecified artificial knee joint: Secondary | ICD-10-CM

## 2022-11-05 HISTORY — PX: TOTAL KNEE REVISION: SHX996

## 2022-11-05 LAB — GLUCOSE, CAPILLARY
Glucose-Capillary: 111 mg/dL — ABNORMAL HIGH (ref 70–99)
Glucose-Capillary: 131 mg/dL — ABNORMAL HIGH (ref 70–99)

## 2022-11-05 LAB — TYPE AND SCREEN
ABO/RH(D): A POS
Antibody Screen: NEGATIVE

## 2022-11-05 LAB — ABO/RH: ABO/RH(D): A POS

## 2022-11-05 SURGERY — TOTAL KNEE REVISION
Anesthesia: Regional | Site: Knee | Laterality: Right

## 2022-11-05 MED ORDER — METHOCARBAMOL 500 MG IVPB - SIMPLE MED: 500.0000 mg | Freq: Four times a day (QID) | INTRAVENOUS | Status: DC | PRN

## 2022-11-05 MED ORDER — ONDANSETRON HCL 4 MG/2ML IJ SOLN
INTRAMUSCULAR | Status: DC | PRN
  Administered 2022-11-05: 4 mg via INTRAVENOUS

## 2022-11-05 MED ORDER — POLYETHYLENE GLYCOL 3350 17 G PO PACK: 17.0000 g | PACK | Freq: Every day | ORAL | Status: DC | PRN

## 2022-11-05 MED ORDER — DIPHENHYDRAMINE HCL 12.5 MG/5ML PO ELIX: 12.5000 mg | ORAL_SOLUTION | ORAL | Status: DC | PRN

## 2022-11-05 MED ORDER — FENTANYL CITRATE (PF) 100 MCG/2ML IJ SOLN
INTRAMUSCULAR | Status: AC
  Filled 2022-11-05: qty 2

## 2022-11-05 MED ORDER — 0.9 % SODIUM CHLORIDE (POUR BTL) OPTIME
TOPICAL | Status: DC | PRN
  Administered 2022-11-05: 1000 mL

## 2022-11-05 MED ORDER — SODIUM CHLORIDE 0.9 % IR SOLN
Status: DC | PRN
  Administered 2022-11-05: 1000 mL

## 2022-11-05 MED ORDER — PIOGLITAZONE HCL 15 MG PO TABS
30.0000 mg | ORAL_TABLET | Freq: Every day | ORAL | Status: DC
  Administered 2022-11-06: 30 mg via ORAL
  Filled 2022-11-05: qty 2

## 2022-11-05 MED ORDER — METHOCARBAMOL 500 MG PO TABS
500.0000 mg | ORAL_TABLET | Freq: Four times a day (QID) | ORAL | Status: DC | PRN
  Administered 2022-11-05 (×2): 500 mg via ORAL
  Filled 2022-11-05: qty 1

## 2022-11-05 MED ORDER — DEXAMETHASONE SODIUM PHOSPHATE 10 MG/ML IJ SOLN
8.0000 mg | Freq: Once | INTRAMUSCULAR | Status: AC
  Administered 2022-11-05: 4 mg via INTRAVENOUS

## 2022-11-05 MED ORDER — DEXAMETHASONE SODIUM PHOSPHATE 10 MG/ML IJ SOLN
INTRAMUSCULAR | Status: AC
  Filled 2022-11-05: qty 1

## 2022-11-05 MED ORDER — BUPIVACAINE LIPOSOME 1.3 % IJ SUSP
INTRAMUSCULAR | Status: DC | PRN
  Administered 2022-11-05: 20 mL

## 2022-11-05 MED ORDER — POVIDONE-IODINE 10 % EX SWAB: 2.0000 | Freq: Once | CUTANEOUS | Status: DC

## 2022-11-05 MED ORDER — PROPOFOL 500 MG/50ML IV EMUL
INTRAVENOUS | Status: DC | PRN
  Administered 2022-11-05: 70 mg via INTRAVENOUS

## 2022-11-05 MED ORDER — PHENOL 1.4 % MT LIQD: 1.0000 | OROMUCOSAL | Status: DC | PRN

## 2022-11-05 MED ORDER — MIDAZOLAM HCL 2 MG/2ML IJ SOLN
INTRAMUSCULAR | Status: AC
  Filled 2022-11-05: qty 2

## 2022-11-05 MED ORDER — MIDAZOLAM HCL 5 MG/5ML IJ SOLN
INTRAMUSCULAR | Status: DC | PRN
  Administered 2022-11-05: 2 mg via INTRAVENOUS

## 2022-11-05 MED ORDER — ACETAMINOPHEN 325 MG PO TABS: 325.0000 mg | ORAL_TABLET | Freq: Four times a day (QID) | ORAL | Status: DC | PRN

## 2022-11-05 MED ORDER — PANTOPRAZOLE SODIUM 40 MG PO TBEC
40.0000 mg | DELAYED_RELEASE_TABLET | Freq: Every day | ORAL | Status: DC
  Administered 2022-11-05 – 2022-11-06 (×2): 40 mg via ORAL
  Filled 2022-11-05 (×2): qty 1

## 2022-11-05 MED ORDER — GLYCOPYRROLATE 0.2 MG/ML IJ SOLN
INTRAMUSCULAR | Status: AC
  Filled 2022-11-05: qty 1

## 2022-11-05 MED ORDER — ISOPROPYL ALCOHOL 70 % SOLN
Status: AC
  Filled 2022-11-05: qty 480

## 2022-11-05 MED ORDER — VANCOMYCIN HCL 1000 MG IV SOLR
INTRAVENOUS | Status: DC | PRN
  Administered 2022-11-05: 1000 mg

## 2022-11-05 MED ORDER — ONDANSETRON HCL 4 MG/2ML IJ SOLN
INTRAMUSCULAR | Status: AC
  Filled 2022-11-05: qty 2

## 2022-11-05 MED ORDER — PROPOFOL 1000 MG/100ML IV EMUL
INTRAVENOUS | Status: AC
  Filled 2022-11-05: qty 100

## 2022-11-05 MED ORDER — ASPIRIN 81 MG PO CHEW
81.0000 mg | CHEWABLE_TABLET | Freq: Two times a day (BID) | ORAL | Status: DC
  Administered 2022-11-05 – 2022-11-06 (×2): 81 mg via ORAL
  Filled 2022-11-05 (×2): qty 1

## 2022-11-05 MED ORDER — ROPIVACAINE HCL 5 MG/ML IJ SOLN
INTRAMUSCULAR | Status: DC | PRN
  Administered 2022-11-05: 30 mL via PERINEURAL

## 2022-11-05 MED ORDER — CEFAZOLIN SODIUM-DEXTROSE 2-4 GM/100ML-% IV SOLN
2.0000 g | Freq: Four times a day (QID) | INTRAVENOUS | Status: AC
  Administered 2022-11-05 (×2): 2 g via INTRAVENOUS
  Filled 2022-11-05 (×2): qty 100

## 2022-11-05 MED ORDER — OXYCODONE HCL 5 MG PO TABS: 5.0000 mg | ORAL_TABLET | ORAL | 0 refills | Status: AC | PRN

## 2022-11-05 MED ORDER — ACETAMINOPHEN 500 MG PO TABS
1000.0000 mg | ORAL_TABLET | Freq: Once | ORAL | Status: AC
  Administered 2022-11-05: 1000 mg via ORAL
  Filled 2022-11-05: qty 2

## 2022-11-05 MED ORDER — MENTHOL 3 MG MT LOZG: 1.0000 | LOZENGE | OROMUCOSAL | Status: DC | PRN

## 2022-11-05 MED ORDER — LACTATED RINGERS IV SOLN: INTRAVENOUS | Status: DC

## 2022-11-05 MED ORDER — CEFAZOLIN SODIUM 1 G IJ SOLR
INTRAMUSCULAR | Status: AC
  Filled 2022-11-05: qty 20

## 2022-11-05 MED ORDER — ACETAMINOPHEN 500 MG PO TABS: 1000.0000 mg | ORAL_TABLET | Freq: Three times a day (TID) | ORAL | 0 refills | Status: AC | PRN

## 2022-11-05 MED ORDER — TRANEXAMIC ACID 1000 MG/10ML IV SOLN
INTRAVENOUS | Status: DC | PRN
  Administered 2022-11-05: 2000 mg via TOPICAL

## 2022-11-05 MED ORDER — TRANEXAMIC ACID-NACL 1000-0.7 MG/100ML-% IV SOLN
1000.0000 mg | INTRAVENOUS | Status: AC
  Administered 2022-11-05: 1000 mg via INTRAVENOUS
  Filled 2022-11-05: qty 100

## 2022-11-05 MED ORDER — AMISULPRIDE (ANTIEMETIC) 5 MG/2ML IV SOLN: 10.0000 mg | Freq: Once | INTRAVENOUS | Status: DC | PRN

## 2022-11-05 MED ORDER — FENTANYL CITRATE (PF) 100 MCG/2ML IJ SOLN
INTRAMUSCULAR | Status: DC | PRN
  Administered 2022-11-05 (×2): 50 ug via INTRAVENOUS

## 2022-11-05 MED ORDER — LIDOCAINE HCL (PF) 2 % IJ SOLN
INTRAMUSCULAR | Status: DC | PRN
  Administered 2022-11-05: 60 mg via INTRADERMAL

## 2022-11-05 MED ORDER — CEFAZOLIN SODIUM-DEXTROSE 2-4 GM/100ML-% IV SOLN
2.0000 g | INTRAVENOUS | Status: AC
  Administered 2022-11-05 (×2): 2 g via INTRAVENOUS
  Filled 2022-11-05: qty 100

## 2022-11-05 MED ORDER — ONDANSETRON HCL 4 MG/2ML IJ SOLN: 4.0000 mg | Freq: Four times a day (QID) | INTRAMUSCULAR | Status: DC | PRN

## 2022-11-05 MED ORDER — HYDROMORPHONE HCL 1 MG/ML IJ SOLN: 0.5000 mg | INTRAMUSCULAR | Status: DC | PRN

## 2022-11-05 MED ORDER — ATORVASTATIN CALCIUM 10 MG PO TABS
10.0000 mg | ORAL_TABLET | Freq: Every day | ORAL | Status: DC
  Administered 2022-11-05: 10 mg via ORAL
  Filled 2022-11-05: qty 1

## 2022-11-05 MED ORDER — WATER FOR IRRIGATION, STERILE IR SOLN
Status: DC | PRN
  Administered 2022-11-05: 2000 mL

## 2022-11-05 MED ORDER — ASPIRIN 81 MG PO TBEC: 81.0000 mg | DELAYED_RELEASE_TABLET | Freq: Two times a day (BID) | ORAL | 0 refills | Status: AC

## 2022-11-05 MED ORDER — SODIUM CHLORIDE (PF) 0.9 % IJ SOLN
INTRAMUSCULAR | Status: AC
  Filled 2022-11-05: qty 10

## 2022-11-05 MED ORDER — CELECOXIB 100 MG PO CAPS: 100.0000 mg | ORAL_CAPSULE | Freq: Two times a day (BID) | ORAL | 0 refills | Status: AC

## 2022-11-05 MED ORDER — INSULIN ASPART 100 UNIT/ML IJ SOLN: 0.0000 [IU] | INTRAMUSCULAR | Status: DC | PRN

## 2022-11-05 MED ORDER — KETOROLAC TROMETHAMINE 15 MG/ML IJ SOLN
7.5000 mg | Freq: Four times a day (QID) | INTRAMUSCULAR | Status: AC
  Administered 2022-11-05 – 2022-11-06 (×4): 7.5 mg via INTRAVENOUS
  Filled 2022-11-05 (×4): qty 1

## 2022-11-05 MED ORDER — EPHEDRINE 5 MG/ML INJ
INTRAVENOUS | Status: AC
  Filled 2022-11-05: qty 5

## 2022-11-05 MED ORDER — METHOCARBAMOL 500 MG PO TABS
ORAL_TABLET | ORAL | Status: AC
  Filled 2022-11-05: qty 1

## 2022-11-05 MED ORDER — PHENYLEPHRINE HCL-NACL 20-0.9 MG/250ML-% IV SOLN
INTRAVENOUS | Status: AC
  Filled 2022-11-05: qty 250

## 2022-11-05 MED ORDER — SODIUM CHLORIDE 0.9 % IV SOLN: INTRAVENOUS | Status: DC

## 2022-11-05 MED ORDER — CEFADROXIL 500 MG PO CAPS: 500.0000 mg | ORAL_CAPSULE | Freq: Two times a day (BID) | ORAL | Status: DC

## 2022-11-05 MED ORDER — LIDOCAINE HCL (PF) 2 % IJ SOLN
INTRAMUSCULAR | Status: AC
  Filled 2022-11-05: qty 5

## 2022-11-05 MED ORDER — DAPAGLIFLOZIN PROPANEDIOL 10 MG PO TABS
10.0000 mg | ORAL_TABLET | Freq: Every day | ORAL | Status: DC
  Administered 2022-11-06: 10 mg via ORAL
  Filled 2022-11-05: qty 1

## 2022-11-05 MED ORDER — BUPIVACAINE HCL 0.25 % IJ SOLN
INTRAMUSCULAR | Status: AC
  Filled 2022-11-05: qty 1

## 2022-11-05 MED ORDER — TRANEXAMIC ACID 1000 MG/10ML IV SOLN
2000.0000 mg | Freq: Once | INTRAVENOUS | Status: DC
  Filled 2022-11-05: qty 20

## 2022-11-05 MED ORDER — EPHEDRINE SULFATE (PRESSORS) 50 MG/ML IJ SOLN
INTRAMUSCULAR | Status: DC | PRN
  Administered 2022-11-05: 10 mg via INTRAVENOUS

## 2022-11-05 MED ORDER — METFORMIN HCL ER 750 MG PO TB24
750.0000 mg | ORAL_TABLET | Freq: Every day | ORAL | Status: DC
  Administered 2022-11-06: 750 mg via ORAL
  Filled 2022-11-05: qty 1

## 2022-11-05 MED ORDER — ONDANSETRON HCL 4 MG PO TABS: 4.0000 mg | ORAL_TABLET | Freq: Four times a day (QID) | ORAL | Status: DC | PRN

## 2022-11-05 MED ORDER — ONDANSETRON HCL 4 MG PO TABS: 4.0000 mg | ORAL_TABLET | Freq: Three times a day (TID) | ORAL | 0 refills | Status: AC | PRN

## 2022-11-05 MED ORDER — SODIUM CHLORIDE 0.9% FLUSH
INTRAVENOUS | Status: DC | PRN
  Administered 2022-11-05: 30 mL

## 2022-11-05 MED ORDER — PROPOFOL 10 MG/ML IV BOLUS
INTRAVENOUS | Status: DC | PRN
  Administered 2022-11-05: 90 ug/kg/min via INTRAVENOUS

## 2022-11-05 MED ORDER — BUPIVACAINE LIPOSOME 1.3 % IJ SUSP: 20.0000 mL | Freq: Once | INTRAMUSCULAR | Status: DC

## 2022-11-05 MED ORDER — ONDANSETRON HCL 4 MG/2ML IJ SOLN: 4.0000 mg | Freq: Once | INTRAMUSCULAR | Status: DC | PRN

## 2022-11-05 MED ORDER — VANCOMYCIN HCL 1000 MG IV SOLR
INTRAVENOUS | Status: AC
  Filled 2022-11-05: qty 20

## 2022-11-05 MED ORDER — OXYCODONE HCL 5 MG PO TABS
5.0000 mg | ORAL_TABLET | ORAL | Status: DC | PRN
  Filled 2022-11-05: qty 1

## 2022-11-05 MED ORDER — ISOPROPYL ALCOHOL 70 % SOLN
Status: DC | PRN
  Administered 2022-11-05: 1 via TOPICAL

## 2022-11-05 MED ORDER — POLYETHYLENE GLYCOL 3350 17 G PO PACK: 17.0000 g | PACK | Freq: Every day | ORAL | 0 refills | Status: DC

## 2022-11-05 MED ORDER — DOCUSATE SODIUM 100 MG PO CAPS
100.0000 mg | ORAL_CAPSULE | Freq: Two times a day (BID) | ORAL | Status: DC
  Administered 2022-11-05 – 2022-11-06 (×2): 100 mg via ORAL
  Filled 2022-11-05 (×2): qty 1

## 2022-11-05 MED ORDER — OMEPRAZOLE 40 MG PO CPDR: 40.0000 mg | DELAYED_RELEASE_CAPSULE | Freq: Every day | ORAL | 0 refills | Status: DC

## 2022-11-05 MED ORDER — ACETAMINOPHEN 500 MG PO TABS
1000.0000 mg | ORAL_TABLET | Freq: Four times a day (QID) | ORAL | Status: AC
  Administered 2022-11-05 – 2022-11-06 (×4): 1000 mg via ORAL
  Filled 2022-11-05 (×4): qty 2

## 2022-11-05 MED ORDER — CHLORHEXIDINE GLUCONATE 0.12 % MT SOLN
15.0000 mL | Freq: Once | OROMUCOSAL | Status: AC
  Administered 2022-11-05: 15 mL via OROMUCOSAL

## 2022-11-05 MED ORDER — METHOCARBAMOL 500 MG PO TABS: 500.0000 mg | ORAL_TABLET | Freq: Three times a day (TID) | ORAL | 0 refills | Status: AC | PRN

## 2022-11-05 MED ORDER — ORAL CARE MOUTH RINSE: 15.0000 mL | Freq: Once | OROMUCOSAL | Status: AC

## 2022-11-05 MED ORDER — SODIUM CHLORIDE (PF) 0.9 % IJ SOLN
INTRAMUSCULAR | Status: AC
  Filled 2022-11-05: qty 50

## 2022-11-05 MED ORDER — FENTANYL CITRATE PF 50 MCG/ML IJ SOSY: 25.0000 ug | PREFILLED_SYRINGE | INTRAMUSCULAR | Status: DC | PRN

## 2022-11-05 MED ORDER — PHENYLEPHRINE HCL-NACL 20-0.9 MG/250ML-% IV SOLN
INTRAVENOUS | Status: DC | PRN
  Administered 2022-11-05: 30 ug/min via INTRAVENOUS

## 2022-11-05 MED ORDER — BUPIVACAINE HCL (PF) 0.5 % IJ SOLN
INTRAMUSCULAR | Status: DC | PRN
  Administered 2022-11-05: 15 mg via INTRATHECAL

## 2022-11-05 SURGICAL SUPPLY — 81 items
ADH SKN CLS APL DERMABOND .7 (GAUZE/BANDAGES/DRESSINGS) ×2
ADPR FEM 6 OFST REV STM STRL (Knees) ×1 IMPLANT
APL PRP STRL LF DISP 70% ISPRP (MISCELLANEOUS) ×2
ATTUNE PSRP INSR SZ7 5 KNEE (Insert) IMPLANT
BAG COUNTER SPONGE SURGICOUNT (BAG) IMPLANT
BAG SPNG CNTER NS LX DISP (BAG)
BLADE HEX COATED 2.75 (ELECTRODE) ×1 IMPLANT
BLADE SAG 18X100X1.27 (BLADE) ×1 IMPLANT
BLADE SAW SAG 35X64 .89 (BLADE) ×1 IMPLANT
BLADE SAW SGTL 81X20 HD (BLADE) IMPLANT
BNDG CMPR 5X4 CHSV STRCH STRL (GAUZE/BANDAGES/DRESSINGS) ×1
BNDG CMPR MED 10X6 ELC LF (GAUZE/BANDAGES/DRESSINGS) ×1
BNDG COHESIVE 4X5 TAN STRL LF (GAUZE/BANDAGES/DRESSINGS) ×1 IMPLANT
BNDG ELASTIC 6X10 VLCR STRL LF (GAUZE/BANDAGES/DRESSINGS) ×1 IMPLANT
BOWL SMART MIX CTS (DISPOSABLE) ×1 IMPLANT
CANISTER WOUND CARE 500ML ATS (WOUND CARE) ×1 IMPLANT
CEMENT BONE REFOBACIN R1X40 US (Cement) ×2 IMPLANT
CHLORAPREP W/TINT 26 (MISCELLANEOUS) ×2 IMPLANT
CNTNR URN SCR LID CUP LEK RST (MISCELLANEOUS) IMPLANT
COMP FEM ATTUNE CRS SZ7 RT (Femur) ×1 IMPLANT
COMPONENT FEM ATN CRS SZ7 RT (Femur) IMPLANT
CONT SPEC 4OZ STRL OR WHT (MISCELLANEOUS)
COVER SURGICAL LIGHT HANDLE (MISCELLANEOUS) ×1 IMPLANT
CUFF TOURN SGL QUICK 34 (TOURNIQUET CUFF) ×1
CUFF TRNQT CYL 34X4.125X (TOURNIQUET CUFF) ×1 IMPLANT
DERMABOND ADVANCED .7 DNX12 (GAUZE/BANDAGES/DRESSINGS) ×1 IMPLANT
DRAPE INCISE IOBAN 66X45 STRL (DRAPES) IMPLANT
DRAPE INCISE IOBAN 85X60 (DRAPES) ×1 IMPLANT
DRAPE SHEET LG 3/4 BI-LAMINATE (DRAPES) ×1 IMPLANT
DRAPE U-SHAPE 47X51 STRL (DRAPES) ×1 IMPLANT
DRESSING AQUACEL AG SP 3.5X10 (GAUZE/BANDAGES/DRESSINGS) ×1 IMPLANT
DRESSING PEEL AND PLAC PRVNA20 (GAUZE/BANDAGES/DRESSINGS) ×1 IMPLANT
DRESSING PREVENA PLUS CUSTOM (GAUZE/BANDAGES/DRESSINGS) IMPLANT
DRSG AQUACEL AG ADV 3.5X10 (GAUZE/BANDAGES/DRESSINGS) IMPLANT
DRSG AQUACEL AG SP 3.5X10 (GAUZE/BANDAGES/DRESSINGS) ×1
DRSG PEEL AND PLACE PREVENA 20 (GAUZE/BANDAGES/DRESSINGS) ×1
DRSG PREVENA PLUS CUSTOM (GAUZE/BANDAGES/DRESSINGS) ×1
GLOVE BIO SURGEON STRL SZ7.5 (GLOVE) ×2 IMPLANT
GLOVE BIO SURGEON STRL SZ8 (GLOVE) ×2 IMPLANT
GLOVE BIOGEL PI IND STRL 7.5 (GLOVE) ×1 IMPLANT
GLOVE BIOGEL PI IND STRL 8 (GLOVE) ×1 IMPLANT
GOWN STRL REUS W/ TWL XL LVL3 (GOWN DISPOSABLE) ×1 IMPLANT
GOWN STRL REUS W/TWL XL LVL3 (GOWN DISPOSABLE) ×1
HANDPIECE INTERPULSE COAX TIP (DISPOSABLE) ×1
HOLDER FOLEY CATH W/STRAP (MISCELLANEOUS) IMPLANT
HOOD PEEL AWAY T7 (MISCELLANEOUS) ×3 IMPLANT
IMPL TAPESTRY BIOINTEGR 70X50 (Orthopedic Implant) IMPLANT
KIT DRSG PREVENA PLUS 7DAY 125 (MISCELLANEOUS) ×1 IMPLANT
KIT TURNOVER KIT A (KITS) IMPLANT
MANIFOLD NEPTUNE II (INSTRUMENTS) ×1 IMPLANT
MARKER SKIN DUAL TIP RULER LAB (MISCELLANEOUS) ×2 IMPLANT
NS IRRIG 1000ML POUR BTL (IV SOLUTION) ×1 IMPLANT
PACK TOTAL KNEE CUSTOM (KITS) ×1 IMPLANT
PROTECTOR NERVE ULNAR (MISCELLANEOUS) ×1 IMPLANT
SET HNDPC FAN SPRY TIP SCT (DISPOSABLE) ×1 IMPLANT
SOLUTION IRRIG SURGIPHOR (IV SOLUTION) ×1 IMPLANT
SPIKE FLUID TRANSFER (MISCELLANEOUS) ×1 IMPLANT
STEM ADAPTOR REV OFFST 6 (Knees) IMPLANT
STEM STR ATTUNE PF 18X110 (Knees) IMPLANT
SUT ETHILON 3 0 FSL (SUTURE) IMPLANT
SUT ETHILON 3 0 PS 1 (SUTURE) ×4 IMPLANT
SUT MNCRL AB 3-0 PS2 18 (SUTURE) ×1 IMPLANT
SUT STRATAFIX 0 PDS 27 VIOLET (SUTURE) ×1
SUT STRATAFIX 0 PDS+ CT-2 23 (SUTURE) ×1
SUT STRATAFIX 14 PDO 48 VLT (SUTURE) IMPLANT
SUT STRATAFIX 1PDS 45CM VIOLET (SUTURE) ×1 IMPLANT
SUT STRATAFIX PDO 1 14 VIOLET (SUTURE) ×2
SUT STRATFX PDO 1 14 VIOLET (SUTURE) ×1
SUT VIC AB 0 CT1 36 (SUTURE) IMPLANT
SUT VIC AB 1 CT1 27 (SUTURE) ×1
SUT VIC AB 1 CT1 27XBRD ANTBC (SUTURE) IMPLANT
SUT VIC AB 2-0 CT2 27 (SUTURE) ×2 IMPLANT
SUTURE STRATFX 0 PDS 27 VIOLET (SUTURE) ×1 IMPLANT
SUTURE STRATFX 0 PDS+ CT-2 23 (SUTURE) IMPLANT
SUTURE STRATFX PDO 1 14 VIOLET (SUTURE) ×1 IMPLANT
SYR 50ML LL SCALE MARK (SYRINGE) ×1 IMPLANT
TRAY FOLEY MTR SLVR 14FR STAT (SET/KITS/TRAYS/PACK) IMPLANT
TRAY FOLEY MTR SLVR 16FR STAT (SET/KITS/TRAYS/PACK) IMPLANT
TUBE SUCTION HIGH CAP CLEAR NV (SUCTIONS) ×1 IMPLANT
UNDERPAD 30X36 HEAVY ABSORB (UNDERPADS AND DIAPERS) ×1 IMPLANT
WRAP KNEE MAXI GEL POST OP (GAUZE/BANDAGES/DRESSINGS) IMPLANT

## 2022-11-05 NOTE — Transfer of Care (Signed)
Immediate Anesthesia Transfer of Care Note  Patient: Steven Buck  Procedure(s) Performed: TOTAL KNEE REVISION (Right: Knee)  Patient Location: PACU  Anesthesia Type:Spinal  Level of Consciousness: awake, alert , oriented, and patient cooperative  Airway & Oxygen Therapy: Patient Spontanous Breathing and Patient connected to face mask oxygen  Post-op Assessment: Report given to RN and Post -op Vital signs reviewed and stable  Post vital signs: Reviewed and stable  Last Vitals:  Vitals Value Taken Time  BP 121/74 11/05/22 1150  Temp    Pulse 72 11/05/22 1150  Resp 10 11/05/22 1150  SpO2 100 % 11/05/22 1150  Vitals shown include unvalidated device data.  Last Pain:  Vitals:   11/05/22 0545  TempSrc: Oral         Complications: No notable events documented.

## 2022-11-05 NOTE — Discharge Instructions (Signed)

## 2022-11-05 NOTE — Evaluation (Signed)
Physical Therapy Evaluation Patient Details Name: Steven Buck MRN: [id number] DOB: 11-21-1950 Today's Date: 11/05/2022  History of Present Illness  72 yo male presents to therapy s/p R TKA revision on 11/05/2022 due to failure of conservative measures. Pt underwent R TKA in 2022. Pt PMH includes but is not limited to: DM II, HDL and HTN.  Clinical Impression    Steven Buck is a 72 y.o. male POD 0 s/p R TKA revision. Patient reports IND with mobility at baseline. Patient is now limited by functional impairments (see PT problem list below) and requires S for bed mobility and min guard and cues for transfers. Patient was able to ambulate 40 feet with RW and min guard level of assist. Patient instructed in exercise to facilitate ROM and circulation to manage edema. Pt reports 5/10 R LE pain. Pt has wound vac R LE. Nurse made PT aware of bradycardia with PR in 40s--no pt report of dizziness with PT evaluation. Patient will benefit from continued skilled PT interventions to address impairments and progress towards PLOF. Acute PT will follow to progress mobility and stair training in preparation for safe discharge home with family assist and OPPT services.        Assistance Recommended at Discharge Intermittent Supervision/Assistance  If plan is discharge home, recommend the following:  Can travel by private vehicle  A little help with walking and/or transfers;A little help with bathing/dressing/bathroom;Assistance with cooking/housework;Assist for transportation;Help with stairs or ramp for entrance        Equipment Recommendations None recommended by PT (spouse reports DME in home setting)  Recommendations for Other Services       Functional Status Assessment Patient has had a recent decline in their functional status and demonstrates the ability to make significant improvements in function in a reasonable and predictable amount of time.     Precautions / Restrictions  Precautions Precautions: Knee;Fall Restrictions Weight Bearing Restrictions: Yes RLE Weight Bearing: Weight bearing as tolerated Other Position/Activity Restrictions: wound vac R LE      Mobility  Bed Mobility Overal bed mobility: Needs Assistance Bed Mobility: Supine to Sit     Supine to sit: Supervision, HOB elevated     General bed mobility comments: min cues    Transfers Overall transfer level: Needs assistance Equipment used: Rolling walker (2 wheels) Transfers: Sit to/from Stand Sit to Stand: Min guard           General transfer comment: cues for proper UE placement    Ambulation/Gait Ambulation/Gait assistance: Min guard Gait Distance (Feet): 40 Feet Assistive device: Rolling walker (2 wheels) Gait Pattern/deviations: Step-to pattern, Antalgic, Trunk flexed Gait velocity: decreased     General Gait Details: cues for proper sequencing and maintaining RW on floor  Stairs            Wheelchair Mobility     Tilt Bed    Modified Rankin (Stroke Patients Only)       Balance Overall balance assessment: Needs assistance Sitting-balance support: Feet supported Sitting balance-Leahy Scale: Good     Standing balance support: Bilateral upper extremity supported, During functional activity, Reliant on assistive device for balance Standing balance-Leahy Scale: Poor                               Pertinent Vitals/Pain Pain Assessment Pain Assessment: 0-10 Pain Score: 5  Pain Location: R Knee Pain Descriptors / Indicators: Constant, Discomfort, Operative site guarding Pain Intervention(s):  Limited activity within patient's tolerance, Monitored during session, Premedicated before session, Repositioned, Ice applied    Home Living Family/patient expects to be discharged to:: Private residence Living Arrangements: Spouse/significant other;Parent;Children;Non-relatives/Friends Available Help at Discharge: Family Type of Home: House Home  Access: Stairs to enter Entrance Stairs-Rails: None Entrance Stairs-Number of Steps: 1   Home Layout: Two level;Able to live on main level with bedroom/bathroom Home Equipment: Rolling Walker (2 wheels)      Prior Function Prior Level of Function : Independent/Modified Independent;Driving             Mobility Comments: IND with all ADLs self care tasks, IADLs       Hand Dominance        Extremity/Trunk Assessment        Lower Extremity Assessment Lower Extremity Assessment: RLE deficits/detail RLE Deficits / Details: ankle DF/PF 5/5; SLR < 10 degree lag RLE Sensation: WNL    Cervical / Trunk Assessment Cervical / Trunk Assessment:  (wfl)  Communication   Communication: No difficulties  Cognition Arousal/Alertness: Awake/alert Behavior During Therapy: Flat affect (suspect medications) Overall Cognitive Status: Within Functional Limits for tasks assessed                                          General Comments      Exercises Total Joint Exercises Ankle Circles/Pumps: AROM, Both, 20 reps Quad Sets: AROM, Right, 5 reps   Assessment/Plan    PT Assessment Patient needs continued PT services  PT Problem List Decreased strength;Decreased range of motion;Decreased activity tolerance;Decreased balance;Decreased mobility;Pain       PT Treatment Interventions DME instruction;Gait training;Stair training;Functional mobility training;Therapeutic activities;Therapeutic exercise;Balance training;Neuromuscular re-education;Patient/family education;Modalities    PT Goals (Current goals can be found in the Care Plan section)  Acute Rehab PT Goals Patient Stated Goal: to go home PT Goal Formulation: With patient Time For Goal Achievement: 11/19/22 Potential to Achieve Goals: Good    Frequency 7X/week     Co-evaluation               AM-PAC PT "6 Clicks" Mobility  Outcome Measure Help needed turning from your back to your side while in a  flat bed without using bedrails?: None Help needed moving from lying on your back to sitting on the side of a flat bed without using bedrails?: None Help needed moving to and from a bed to a chair (including a wheelchair)?: A Little Help needed standing up from a chair using your arms (e.g., wheelchair or bedside chair)?: A Little Help needed to walk in hospital room?: A Little Help needed climbing 3-5 steps with a railing? : Total 6 Click Score: 18    End of Session Equipment Utilized During Treatment: Gait belt       PT Visit Diagnosis: Unsteadiness on feet (R26.81);Other abnormalities of gait and mobility (R26.89);Muscle weakness (generalized) (M62.81);Pain;Difficulty in walking, not elsewhere classified (R26.2) Pain - Right/Left: Right Pain - part of body: Knee;Leg    Time: 9562-1308 PT Time Calculation (min) (ACUTE ONLY): 38 min   Charges:   PT Evaluation $PT Eval Low Complexity: 1 Low PT Treatments $Gait Training: 8-22 mins $Therapeutic Activity: 8-22 mins PT General Charges $$ ACUTE PT VISIT: 1 Visit         Johnny Bridge, PT Acute Rehab   Jacqualyn Posey 11/05/2022, 4:44 PM

## 2022-11-05 NOTE — Anesthesia Procedure Notes (Signed)
Anesthesia Regional Block: Adductor canal block   Pre-Anesthetic Checklist: , timeout performed,  Correct Patient, Correct Site, Correct Laterality,  Correct Procedure,, site marked,  Risks and benefits discussed,  Surgical consent,  Pre-op evaluation,  At surgeon's request and post-op pain management  Laterality: Right  Prep: chloraprep       Needles:  Injection technique: Single-shot  Needle Type: Echogenic Stimulator Needle     Needle Length: 10cm  Needle Gauge: 20     Additional Needles:   Procedures:,,,, ultrasound used (permanent image in chart),,    Narrative:  Start time: 11/05/2022 7:00 AM End time: 11/05/2022 7:10 AM Injection made incrementally with aspirations every 5 mL.  Performed by: Personally  Anesthesiologist: Leonides Grills, MD  Additional Notes: Functioning IV was confirmed and monitors were applied. A time-out was performed. Hand hygiene and sterile gloves were used. The thigh was placed in a frog-leg position and prepped in a sterile fashion. A 20ga Bbraun echogenic stimulator needle was placed using ultrasound guidance.  Negative aspiration and negative test dose prior to incremental administration of local anesthetic. The patient tolerated the procedure well.

## 2022-11-05 NOTE — Progress Notes (Signed)
Orthopedic Tech Progress Note Patient Details:  Steven Buck December 08, 1950 [id number]  Dropped off Bone foam at bedside with PACU RN. Ortho Devices Type of Ortho Device: Bone foam zero knee Ortho Device/Splint Interventions: Floria Raveling 11/05/2022, 12:02 PM

## 2022-11-05 NOTE — Plan of Care (Signed)
  Problem: Activity: Goal: Ability to avoid complications of mobility impairment will improve Outcome: Progressing Goal: Range of joint motion will improve Outcome: Progressing   Problem: Pain Management: Goal: Pain level will decrease with appropriate interventions Outcome: Progressing   Problem: Safety: Goal: Ability to remain free from injury will improve Outcome: Progressing   

## 2022-11-05 NOTE — Anesthesia Procedure Notes (Signed)
Spinal  Patient location during procedure: OR Start time: 11/05/2022 7:30 AM End time: 11/05/2022 7:35 AM Reason for block: surgical anesthesia Staffing Performed: anesthesiologist  Anesthesiologist: Leonides Grills, MD Performed by: Leonides Grills, MD Authorized by: Leonides Grills, MD   Preanesthetic Checklist Completed: patient identified, IV checked, risks and benefits discussed, surgical consent, monitors and equipment checked, pre-op evaluation and timeout performed Spinal Block Patient position: sitting Prep: DuraPrep Patient monitoring: cardiac monitor, continuous pulse ox and blood pressure Approach: midline Location: L4-5 Injection technique: single-shot Needle Needle type: Pencan  Needle gauge: 24 G Needle length: 9 cm Assessment Sensory level: T10 Events: CSF return Additional Notes Functioning IV was confirmed and monitors were applied. Sterile prep and drape, including hand hygiene and sterile gloves were used. The patient was positioned and the spine was prepped. The skin was anesthetized with lidocaine.  Free flow of clear CSF was obtained prior to injecting local anesthetic into the CSF.  The spinal needle aspirated freely following injection.  The needle was carefully withdrawn.  The patient tolerated the procedure well.

## 2022-11-05 NOTE — Op Note (Signed)
DATE OF SURGERY:  11/05/2022 TIME: 11:49 AM  PATIENT NAME:  Steven Buck   AGE: 72 y.o.    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  Complete synovectomy of the right knee joint [address]: Capsular repair with augmentation with tapestry bio integrative implant  SURGEON:  Dania Marsan A Heily Carlucci, MD   ASSISTANT: Kathie Dike, PA-C, present and scrubbed throughout the case, critical for assistance with exposure, retraction, instrumentation, and closure.  OPERATIVE IMPLANTS:  Size 7 attune CRS femoral revision right cemented, attune revision 6 mm offset stem adapter, 18 x 110 mm attune revision press-fit stem, 5 mm for size 7 rotating platform posterior stabilized tibial insert     Tapestry bio integrative implants 70 x 50 mm Implant Name Type Inv. Item Serial No. Manufacturer Lot No. LRB No. Used Action  CEMENT BONE REFOBACIN R1X40 [place] - [id number] Cement CEMENT BONE REFOBACIN R1X40 [place]  ZIMMER RECON(ORTH,TRAU,BIO,SG) [id number] Right 1 Implanted  CEMENT BONE REFOBACIN R1X40 [place] - [id number] Cement CEMENT BONE REFOBACIN R1X40 [place]  ZIMMER RECON(ORTH,TRAU,BIO,SG) [id number] Right 1 Implanted  COMP FEM ATTUNE CRS SZ7 RT - [id number] Femur COMP FEM ATTUNE CRS SZ7 RT  DEPUY ORTHOPAEDICS [id number] Right 1 Implanted  STEM STR ATTUNE PF 18X110 - [id number] Knees STEM STR ATTUNE PF 18X110  DEPUY ORTHOPAEDICS [id number] Right 1 Implanted  ADPR FEM 6 OFST REV STM STRL - [id number] Knees ADPR FEM 6 OFST REV STM STRL  DEPUY ORTHOPAEDICS [id number] Right 1 Implanted  ATTUNE PSRP INSR SZ7 5 KNEE - [id number] Insert ATTUNE PSRP INSR SZ7 5 KNEE  DEPUY ORTHOPAEDICS [id number] Right 1 Implanted  IMPL TAPESTRY BIOINTEGR 70X50 - [id number] Orthopedic Implant IMPL TAPESTRY BIOINTEGR 70X50  ZIMMER  RECON(ORTH,TRAU,BIO,SG) [id number] Right 1 Implanted      PREOPERATIVE INDICATIONS:  Steven Buck is a 72 y.o. year old male with Severe preoperative stiffness after total knee arthroplasty done 2 years ago, unfortunately has had progressive worsening range of motion since surgery.  Has significant pain dysfunction with mobility and stiffness.  Given continued difficulties now 2 years out recommended revision of his total knee arthroplasty.  The risks, benefits, and alternatives were discussed at length including but not limited to the risks of infection, bleeding, nerve injury, stiffness, blood clots, the need for revision surgery, cardiopulmonary complications, among others, and they were willing to proceed.  OPERATIVE FINDINGS AND UNIQUE ASPECTS OF THE CASE: Severe preoperative stiffness after total knee arthroplasty done 2 years ago.  Preoperative range of motion only 20 to 75 degrees.  With complete synovectomy and liner exchange from a 7 mm to a 5 mm insert improved range of motion approximately 10 to 100 degrees.  Elected to next remove the well-fixed femoral component and place a revision stem.  Elevated the joint line by 4 mm and downsized the femoral component from a size 8 to a size 7.  This along with a 5 mm insert had much improved stability and range of motion.  ESTIMATED BLOOD LOSS: 150cc  OPERATIVE DESCRIPTION:   Once adequate anesthesia was induced, preoperative antibiotics, 2 gm of ancef,1 gm of Tranexamic Acid, and 8 mg of Decadron administered, the patient was positioned supine with a right thigh tourniquet placed.  The right lower extremity was prepped and draped in sterile fashion.  A time-  out was performed identifying the patient, planned  procedure, and the appropriate extremity.     The leg was  exsanguinated, tourniquet elevated to 250 mmHg.  A midline incision was  made followed by median parapatellar arthrotomy.  A complete 3 and 60 degrees synovectomy was performed to  remove the thickened scar tissue throughout the knee and to facilitate exposure.  No evidence of purulence or infection was appreciated in the knee.. A medial release was performed, the infrapatellar fat pad was resected with care taken to protect the patellar tendon. The suprapatellar fat was removed to exposed the distal anterior femur.  The patella was significantly scarred down and did a thorough synovectomy around the patella and lateral side to help with mobilization.  The components appear to be well-fixed.  Notably the knee was especially tight with no plying in either flexion or extension.  The old RP insert was able to be removed and a trial 5 mm insert was placed.  Only even with the downsized liner and synovectomy of the knee still felt very tight.  Had about a 10 degree flexion contracture as well as lift off of the tray anteriorly at 90 degrees of flexion.  I attempted a posterior release of the capsule with a Cobb behind the femoral condyles.  Also did a posterior synovectomy.  And extended the lateral sided release.  Despite this still could not achieve full extension in the knee had no play varus or valgus.  Elected at this point to remove the femoral component to raise the joint line and downsize the femur to improve the flexion space.  Using a ACL blade and osteotomes the femoral component was atraumatically removed.  There was minimal blood bone loss.  Notably the femoral component was well-fixed.  At this point turned my attention to reaming the femoral canal.  Reamed up to an 18 mm x 110 mm femoral stem which had good cortical stability.  Next we recut 4 mm off the distal femur to raise the joint line.  Next we recut for a size 7 femoral component which took about 2 to 3 mm off the posterior condyles both medially and laterally.  At this point the trial for the size 7 component was inserted on the femur.  We noticed that the femoral component needed to be lateralized.  The 6 mm lateral  offset allowed for better positioning of the femoral component.  This was put into place and a 5 mm RP insert was trialed which had full extension now and about 1 to 2 mm of play in varus valgus in extension as well as 3 to 4 mm of play in flexion anterior to posterior.  At this point the real femoral component was opened and assembled on the back table.   The antibiotic cement was mixed.    The femoral implant and pressfit stem were cemented onto cleaned and dried cut surfaces of bone with the knee brought to extension with a 5mm RP poly.  The knee was irrigated with sterile Betadine diluted in saline as well as pulse lavage normal saline. The synovial lining was  then injected a dilute Exparel th 30cc of 0.25% marcaine with epinephrine.         Once the cement had fully cured, excess cement was removed throughout the knee.  I confirmed that I was satisfied with the range of motion and stability, and the final 5mm RP poly insert was chosen.  It was placed into the knee.         The  tourniquet had been let down at 120 minutes.  There was some general oozing from the posterior capsule so to grams of TXA were applied to the intracapsular tissues with a wet lap sponge.  At this point had excellent hemostasis..  The medial parapatellar arthrotomy was then reapproximated using #1 Vicryl  and #1 Stratafix sutures with the knee  in flexion.  Notably the tissue was very thin laterally over the patella so elected to use a tapestry 50 x 70 mm implant to augment the capsular repair.  This was secured with 0 Vicryl interrupted sutures and the corners.  The remaining wound was closed with 0 stratafix, 2-0 Vicryl, and running 3-0 Nylon. The knee was cleaned, dried, dressed sterilely using Prevena incisional wound VAC.  The patient was then brought to recovery room in stable condition, tolerating the procedure  well. There were no complications.   Post op recs: WB: WBAT Abx: ancef in-house discharge on 500 mg  cefadroxil 500 twice daily x 7 days Imaging: PACU xrays DVT prophylaxis: Aspirin 81mg  BID x4 weeks Follow up: 1 weeks after surgery for a wound check with Dr. Blanchie Dessert at [facility].  Address: [address], [place], [place] [address]  Office Phone: [phone]  Weber Cooks, MD Orthopaedic Surgery

## 2022-11-05 NOTE — Interval H&P Note (Signed)
The patient has been re-examined, and the chart reviewed, and there have been no interval changes to the documented history and physical.    Plan for R knee revision for arthrofibrosis. Discussed plan A is to do a synovectomy and liner exchange to downsize the liner. Possibility of revising femoral and or tibial components if still unable to achieve good range of motion after the liner exchange alone.  The operative side was examined and the patient was confirmed to have sensation to DPN, SPN, TN intact, Motor EHL, ext, flex 5/5, and DP 2+, PT 2+, No significant edema.   The risks, benefits, and alternatives have been discussed at length with patient, and the patient is willing to proceed.  Right knee marked. Consent has been signed.

## 2022-11-05 NOTE — Anesthesia Postprocedure Evaluation (Signed)
Anesthesia Post Note  Patient: Steven Buck  Procedure(s) Performed: TOTAL KNEE REVISION (Right: Knee)     Patient location during evaluation: PACU Anesthesia Type: Regional and Spinal Level of consciousness: awake Pain management: pain level controlled Vital Signs Assessment: post-procedure vital signs reviewed and stable Respiratory status: spontaneous breathing, nonlabored ventilation and respiratory function stable Cardiovascular status: blood pressure returned to baseline and stable Postop Assessment: no apparent nausea or vomiting Anesthetic complications: no   No notable events documented.  Last Vitals:  Vitals:   11/05/22 1330 11/05/22 1417  BP: 117/72 119/75  Pulse: (!) 51 (!) 44  Resp: 14 16  Temp:  36.9 C  SpO2: 99% 100%    Last Pain:  Vitals:   11/05/22 1417  TempSrc:   PainSc: 0-No pain                 Tyshika Baldridge P Zsofia Prout

## 2022-11-06 DIAGNOSIS — M24661 Ankylosis, right knee: Secondary | ICD-10-CM | POA: Diagnosis not present

## 2022-11-06 LAB — BASIC METABOLIC PANEL
Anion gap: 7 (ref 5–15)
BUN: 20 mg/dL (ref 8–23)
CO2: 24 mmol/L (ref 22–32)
Calcium: 7.9 mg/dL — ABNORMAL LOW (ref 8.9–10.3)
Chloride: 105 mmol/L (ref 98–111)
Creatinine, Ser: 0.92 mg/dL (ref 0.61–1.24)
GFR, Estimated: >60 mL/min (ref >60)
Glucose, Bld: 102 mg/dL — ABNORMAL HIGH (ref 70–99)
Potassium: 4.1 mmol/L (ref 3.5–5.1)
Sodium: 136 mmol/L (ref 135–145)

## 2022-11-06 LAB — CBC
HCT: 37.9 % — ABNORMAL LOW (ref 39.0–52.0)
Hemoglobin: 12.1 g/dL — ABNORMAL LOW (ref 13.0–17.0)
MCH: 29.6 pg (ref 26.0–34.0)
MCHC: 31.9 g/dL (ref 30.0–36.0)
MCV: 92.7 fL (ref 80.0–100.0)
Platelets: 229 10*3/uL (ref 150–400)
RBC: 4.09 MIL/uL — ABNORMAL LOW (ref 4.22–5.81)
RDW: 13.4 % (ref 11.5–15.5)
WBC: 7.9 10*3/uL (ref 4.0–10.5)
nRBC: 0 % (ref 0.0–0.2)

## 2022-11-06 NOTE — Progress Notes (Addendum)
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 Pt underwent R TKA in 2022. Pt PMH includes but is not limited to: DM II, HDL and HTN.    PT Comments  POD # 1 am session General Comments: AxO x 3 very pleasant and motivated.  Works out at [employer]. Assisted OOB to amb in hallway.  VC's on proper walker to self distance and safety with turns.  VC's to avoid "toe" walking.  Then returned to room to perform some TE's following HEP handout.  Instructed on proper tech, freq as well as use of ICE.   Will see pt again this afternoon to complete HEP Education and practice stairs.     Assistance Recommended at Discharge Intermittent Supervision/Assistance  If plan is discharge home, recommend the following:  Can travel by private vehicle    A little help with walking and/or transfers;A little help with bathing/dressing/bathroom;Assistance with cooking/housework;Assist for transportation;Help with stairs or ramp for entrance      Equipment Recommendations  None recommended by PT;Rolling walker (2 wheels)    Recommendations for Other Services       Precautions / Restrictions Precautions Precautions: Knee;Fall Precaution Comments: no pillow under knee Restrictions Weight Bearing Restrictions: No RLE Weight Bearing: Weight bearing as tolerated Other Position/Activity Restrictions: wound vac R LE     Mobility  Bed Mobility   Bed Mobility: Supine to Sit     Supine to sit: Supervision, HOB elevated     General bed mobility comments: self able with increased time    Transfers Overall transfer level: Needs assistance Equipment used: Rolling walker (2 wheels) Transfers: Sit to/from Stand Sit to Stand: Supervision, Min guard           General transfer comment: cues for  proper UE placement    Ambulation/Gait Ambulation/Gait assistance: Min guard, Supervision Gait Distance (Feet): 85 Feet Assistive device: Rolling walker (2 wheels) Gait Pattern/deviations: Step-to pattern, Antalgic, Trunk flexed, Decreased stance time - right Gait velocity: decreased     General Gait Details: cues for proper sequencing and maintaining RW on floor plus safety with turns   Stairs             Wheelchair Mobility     Tilt Bed    Modified Rankin (Stroke Patients Only)       Balance                                            Cognition Arousal/Alertness: Awake/alert Behavior During Therapy: WFL for tasks assessed/performed Overall Cognitive Status: Within Functional Limits for tasks assessed                                 General Comments: AxO x 3 very pleasant and motivated.  Works out at [employer].        Exercises  Total Knee Replacement/Revision TE's following HEP handout 10 reps B LE ankle pumps 05 reps towel squeezes 05 reps knee presses 05 reps heel slides  05 reps SAQ's 05 reps SLR's 05 reps ABD Educated on use of gait belt to assist with TE's Followed by ICE  General Comments        Pertinent Vitals/Pain Pain Assessment Pain Assessment: 0-10 Pain Score: 7  Pain Location: R Knee Pain Descriptors / Indicators: Constant, Discomfort, Operative site guarding Pain Intervention(s): Monitored during session, Premedicated before session, Repositioned, Ice applied    Home Living                          Prior Function            PT Goals (current goals can now be found in the care plan section) Progress towards PT goals: Progressing toward goals    Frequency    7X/week      PT Plan Current plan remains appropriate    Co-evaluation              AM-PAC PT "6 Clicks" Mobility   Outcome Measure  Help needed turning from your back to your side while in a flat  bed without using bedrails?: A Little Help needed moving from lying on your back to sitting on the side of a flat bed without using bedrails?: A Little Help needed moving to and from a bed to a chair (including a wheelchair)?: A Little Help needed standing up from a chair using your arms (e.g., wheelchair or bedside chair)?: A Little Help needed to walk in hospital room?: A Little Help needed climbing 3-5 steps with a railing? : A Little 6 Click Score: 18    End of Session Equipment Utilized During Treatment: Gait belt Activity Tolerance: Patient tolerated treatment well Patient left: in chair;with call bell/phone within reach Nurse Communication: Mobility status PT Visit Diagnosis: Unsteadiness on feet (R26.81);Other abnormalities of gait and mobility (R26.89);Muscle weakness (generalized) (M62.81);Pain;Difficulty in walking, not elsewhere classified (R26.2) Pain - Right/Left: Right Pain - part of body: Knee;Leg     Time: 1010-1050 PT Time Calculation (min) (ACUTE ONLY): 40 min  Charges:    $Gait Training: 8-22 mins $Therapeutic Exercise: 8-22 mins $Therapeutic Activity: 8-22 mins PT General Charges $$ ACUTE PT VISIT: 1 Visit                     Felecia Shelling  PTA Acute  Rehabilitation Services Office M-F          [phone]

## 2022-11-06 NOTE — TOC Transition Note (Signed)
Transition of Care [facility]) - CM/SW Discharge Note  Patient Details  Name: SNAPPER DECELLES MRN: [id number] Date of Birth: [birth year]  Transition of Care [facility]) CM/SW Contact:  Ewing Schlein, LCSW Phone Number: 11/06/2022, 10:51 AM  Clinical Narrative: Patient is expected to discharge home after working with PT. CSW met with patient to review discharge plan. Patient will go home with OPPT in [place]. Patient has a rolling walker at home, so there are no DME needs at this time. TOC signing off.   Final next level of care: OP Rehab Barriers to Discharge: No Barriers Identified  Patient Goals and CMS Choice Choice offered to / list presented to : NA  Discharge Plan and Services Additional resources added to the After Visit Summary for          DME Arranged: N/A DME Agency: NA  Social Determinants of Health (SDOH) Interventions SDOH Screenings   Food Insecurity: No Food Insecurity (11/05/2022)  Housing: Low Risk  (11/05/2022)  Transportation Needs: No Transportation Needs (11/05/2022)  Utilities: Not At Risk (11/05/2022)  Tobacco Use: Low Risk  (11/05/2022)   Readmission Risk Interventions     No data to display

## 2022-11-06 NOTE — Progress Notes (Signed)
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 Pt underwent R TKA in 2022. Pt PMH includes but is not limited to: DM II, HDL and HTN.    PT Comments  POD # 1 pm session Spouse present Assisted with amb in hallway a second time then practiced stairs.  Then returned to room to perform some TE's following HEP handout.  Instructed on proper tech, freq as well as use of ICE.   Addressed all mobility questions, discussed appropriate activity, educated on use of ICE.  Pt ready for D/C to home.     Assistance Recommended at Discharge Intermittent Supervision/Assistance  If plan is discharge home, recommend the following:  Can travel by private vehicle    A little help with walking and/or transfers;A little help with bathing/dressing/bathroom;Assistance with cooking/housework;Assist for transportation;Help with stairs or ramp for entrance      Equipment Recommendations  None recommended by PT;Rolling walker (2 wheels)    Recommendations for Other Services       Precautions / Restrictions Precautions Precautions: Knee;Fall Precaution Comments: no pillow under knee Restrictions Weight Bearing Restrictions: No RLE Weight Bearing: Weight bearing as tolerated Other Position/Activity Restrictions: wound vac R LE     Mobility  Bed Mobility   Bed Mobility: Supine to Sit     Supine to sit: Supervision, HOB elevated     General bed mobility comments: Pt was OOB in recliner    Transfers Overall transfer level: Needs assistance Equipment used: Rolling walker (2 wheels) Transfers: Sit to/from Stand Sit to Stand: Supervision, Min guard           General transfer comment: cues for proper UE placement    Ambulation/Gait Ambulation/Gait assistance: Min guard, Supervision Gait  Distance (Feet): 65 Feet Assistive device: Rolling walker (2 wheels) Gait Pattern/deviations: Step-to pattern, Antalgic, Trunk flexed, Decreased stance time - right Gait velocity: decreased     General Gait Details: cues for proper sequencing and maintaining RW on floor plus safety with turns   Stairs Stairs: Yes Stairs assistance: Supervision, Min guard Stair Management: Two rails, Step to pattern, Forwards Number of Stairs: 4 General stair comments: VC's on proper sequencing as well as safety   Wheelchair Mobility     Tilt Bed    Modified Rankin (Stroke Patients Only)       Balance                                            Cognition Arousal/Alertness: Awake/alert Behavior During Therapy: WFL for tasks assessed/performed Overall Cognitive Status: Within Functional Limits for tasks assessed                                 General Comments: AxO x 3 very pleasant and motivated.  Works out at [employer].        Exercises   05 reps all seated TE's following TKR/TKRev handout   General Comments        Pertinent Vitals/Pain Pain Assessment Pain Assessment: 0-10 Pain Score: 7  Pain Location: R Knee Pain Descriptors / Indicators: Constant, Discomfort, Operative site guarding Pain Intervention(s): Monitored during session, Premedicated before  session, Repositioned, Ice applied    Home Living                          Prior Function            PT Goals (current goals can now be found in the care plan section) Progress towards PT goals: Progressing toward goals    Frequency    7X/week      PT Plan Current plan remains appropriate    Co-evaluation              AM-PAC PT "6 Clicks" Mobility   Outcome Measure  Help needed turning from your back to your side while in a flat bed without using bedrails?: A Little Help needed moving from lying on your back to sitting on the side of a flat bed without  using bedrails?: A Little Help needed moving to and from a bed to a chair (including a wheelchair)?: A Little Help needed standing up from a chair using your arms (e.g., wheelchair or bedside chair)?: A Little Help needed to walk in hospital room?: A Little Help needed climbing 3-5 steps with a railing? : A Little 6 Click Score: 18    End of Session Equipment Utilized During Treatment: Gait belt Activity Tolerance: Patient tolerated treatment well Patient left: in chair;with call bell/phone within reach Nurse Communication: Mobility status PT Visit Diagnosis: Unsteadiness on feet (R26.81);Other abnormalities of gait and mobility (R26.89);Muscle weakness (generalized) (M62.81);Pain;Difficulty in walking, not elsewhere classified (R26.2) Pain - Right/Left: Right Pain - part of body: Knee;Leg     Time: 1325-1350 PT Time Calculation (min) (ACUTE ONLY): 25 min  Charges:    $Gait Training: 8-22 mins $Therapeutic Exercise: 8-22 mins PT General Charges $$ ACUTE PT VISIT: 1 Visit                     Felecia Shelling  PTA Acute  Rehabilitation Services Office M-F          [phone]

## 2022-11-06 NOTE — Progress Notes (Signed)
Discharge package printed and instructions given to wife. Verbalizes understanding. ?

## 2022-11-06 NOTE — Discharge Summary (Signed)
Physician Discharge Summary  Patient ID: Steven Buck MRN: [id number] DOB/AGE: 10-20-1950 72 y.o.  Admit date: 11/05/2022 Discharge date: 11/06/2022  Admission Diagnoses:  Failed total knee replacement [facility])  Discharge Diagnoses:  Principal Problem:   Failed total knee replacement [facility])   Past Medical History:  Diagnosis Date   Arthritis    Diabetes mellitus without complication (HCC)    Hypertension     Surgeries: Procedure(s): RIGHT TOTAL KNEE REVISION on 11/05/2022   Consultants (if any):   Discharged Condition: Improved  Hospital Course: Steven Buck is an 72 y.o. male who was admitted 11/05/2022 with a diagnosis of Failed total knee replacement (HCC) and went to the operating room on 11/05/2022 and underwent the above named procedures.    He was given perioperative antibiotics:  Anti-infectives (From admission, onward)    Start     Dose/Rate Route Frequency Ordered Stop   11/06/22 2100  cefadroxil (DURICEF) capsule 500 mg        500 mg Oral 2 times daily 11/05/22 1408 11/13/22 2159   11/05/22 1730  ceFAZolin (ANCEF) IVPB 2g/100 mL premix        2 g 200 mL/hr over 30 Minutes Intravenous Every 6 hours 11/05/22 1408 11/06/22 0004   11/05/22 0837  vancomycin (VANCOCIN) powder  Status:  Discontinued          As needed 11/05/22 0837 11/05/22 1407   11/05/22 0600  ceFAZolin (ANCEF) IVPB 2g/100 mL premix        2 g 200 mL/hr over 30 Minutes Intravenous On call to O.R. 11/05/22 0536 11/05/22 1147     .  He was given sequential compression devices, early ambulation, and aspirin for DVT prophylaxis.  He benefited maximally from the hospital stay and there were no complications.    Recent vital signs:  Vitals:   11/06/22 0159 11/06/22 0508  BP: (!) 97/55 (!) 100/54  Pulse: (!) 55 (!) 54  Resp: 16 16  Temp: 98.1 F (36.7 C) 98.1 F (36.7 C)  SpO2: 98% 100%    Recent laboratory studies:  Lab Results  Component Value Date   HGB 12.1 (L) 11/06/2022   HGB 14.6  10/26/2022   HGB 12.0 (L) 10/21/2020   Lab Results  Component Value Date   WBC 7.9 11/06/2022   PLT 229 11/06/2022   No results found for: "INR" Lab Results  Component Value Date   NA 136 11/06/2022   K 4.1 11/06/2022   CL 105 11/06/2022   CO2 24 11/06/2022   BUN 20 11/06/2022   CREATININE 0.92 11/06/2022   GLUCOSE 102 (H) 11/06/2022    Discharge Medications:   Allergies as of 11/06/2022   No Known Allergies      Medication List     TAKE these medications    acetaminophen 500 MG tablet Commonly known as: TYLENOL Take 2 tablets (1,000 mg total) by mouth every 8 (eight) hours as needed.   aspirin EC 81 MG tablet Take 1 tablet (81 mg total) by mouth 2 (two) times daily for 28 days. Swallow whole. What changed:  when to take this additional instructions   atorvastatin 10 MG tablet Commonly known as: LIPITOR Take 1 tablet (10 mg total) by mouth daily. What changed: when to take this   celecoxib 100 MG capsule Commonly known as: CeleBREX Take 1 capsule (100 mg total) by mouth 2 (two) times daily for 14 days.   Cinnamon 500 MG Tabs Take 500 mg by mouth every evening.  Farxiga 10 MG Tabs tablet Generic drug: dapagliflozin propanediol Take 1 tablet (10 mg total) by mouth daily.   metFORMIN 750 MG 24 hr tablet Commonly known as: GLUCOPHAGE-XR Take 1 tablet (750 mg total) by mouth daily.   methocarbamol 500 MG tablet Commonly known as: ROBAXIN Take 1 tablet (500 mg total) by mouth every 8 (eight) hours as needed for up to 10 days for muscle spasms.   omeprazole 40 MG capsule Commonly known as: PRILOSEC Take 1 capsule (40 mg total) by mouth daily for 28 days.   ondansetron 4 MG tablet Commonly known as: Zofran Take 1 tablet (4 mg total) by mouth every 8 (eight) hours as needed for up to 14 days for nausea or vomiting.   oxyCODONE 5 MG immediate release tablet Commonly known as: Roxicodone Take 1 tablet (5 mg total) by mouth every 4 (four) hours as needed  for up to 7 days for severe pain or moderate pain.   pioglitazone 30 MG tablet Commonly known as: ACTOS Take 1 tablet (30 mg total) by mouth daily.   polyethylene glycol 17 g packet Commonly known as: MiraLax Take 17 g by mouth daily.        Diagnostic Studies: DG Knee Right Port  Result Date: 11/05/2022 CLINICAL DATA:  Postoperative state. EXAM: PORTABLE RIGHT KNEE - 1-2 VIEW COMPARISON:  Right knee radiographs 09/14/2017 FINDINGS: Interval 2 right knee arthroplasty. There is a moderately long femoral stem and a short tibial stem. No perihardware lucency is seen to indicate hardware failure or loosening. There is a postoperative wound VAC anterior to the proximal tibia. Expected postoperative intra-articular and subcutaneous air. No acute fracture or dislocation. IMPRESSION: Interval right knee arthroplasty without evidence of hardware failure. Electronically Signed   By: Neita Garnet M.D.   On: 11/05/2022 12:32    Disposition: Discharge disposition: 01-Home or Self Care       Discharge Instructions     Call MD / Call 911   Complete by: As directed    If you experience chest pain or shortness of breath, CALL 911 and be transported to the hospital emergency room.  If you develope a fever above 101 F, pus (white drainage) or increased drainage or redness at the wound, or calf pain, call your surgeon's office.   Constipation Prevention   Complete by: As directed    Drink plenty of fluids.  Prune juice may be helpful.  You may use a stool softener, such as Colace (over the counter) 100 mg twice a day.  Use MiraLax (over the counter) for constipation as needed.   Diet - low sodium heart healthy   Complete by: As directed    Increase activity slowly as tolerated   Complete by: As directed    Post-operative opioid taper instructions:   Complete by: As directed    POST-OPERATIVE OPIOID TAPER INSTRUCTIONS: It is important to wean off of your opioid medication as soon as possible. If  you do not need pain medication after your surgery it is ok to stop day one. Opioids include: Codeine, Hydrocodone(Norco, Vicodin), Oxycodone(Percocet, oxycontin) and hydromorphone amongst others.  Long term and even short term use of opiods can cause: Increased pain response Dependence Constipation Depression Respiratory depression And more.  Withdrawal symptoms can include Flu like symptoms Nausea, vomiting And more Techniques to manage these symptoms Hydrate well Eat regular healthy meals Stay active Use relaxation techniques(deep breathing, meditating, yoga) Do Not substitute Alcohol to help with tapering If you have been on  opioids for less than two weeks and do not have pain than it is ok to stop all together.  Plan to wean off of opioids This plan should start within one week post op of your joint replacement. Maintain the same interval or time between taking each dose and first decrease the dose.  Cut the total daily intake of opioids by one tablet each day Next start to increase the time between doses. The last dose that should be eliminated is the evening dose.           Follow-up Information     Joen Laura, MD Follow up in 1 week(s).   Specialty: Orthopedic Surgery Contact information: [address] [place] [address] [phone]                    Discharge Instructions      INSTRUCTIONS AFTER JOINT REPLACEMENT   Remove items at home which could result in a fall. This includes throw rugs or furniture in walking pathways ICE to the affected joint every three hours while awake for 30 minutes at a time, for at least the first 3-5 days, and then as needed for pain and swelling.  Continue to use ice for pain and swelling. You may notice swelling that will progress down to the foot and ankle.  This is normal after surgery.  Elevate your leg when you are not up walking on it.   Continue to use the breathing machine you got in  the hospital (incentive spirometer) which will help keep your temperature down.  It is common for your temperature to cycle up and down following surgery, especially at night when you are not up moving around and exerting yourself.  The breathing machine keeps your lungs expanded and your temperature down.   DIET:  As you were doing prior to hospitalization, we recommend a well-balanced diet.  DRESSING / WOUND CARE / SHOWERING  Keep the surgical dressing until follow up.  The dressing is water proof, so you can shower without any extra covering.  IF THE DRESSING FALLS OFF or the wound gets wet inside, change the dressing with sterile gauze.  Please use good hand washing techniques before changing the dressing.  Do not use any lotions or creams on the incision until instructed by your surgeon.    ACTIVITY  Increase activity slowly as tolerated, but follow the weight bearing instructions below.   No driving for 6 weeks or until further direction given by your physician.  You cannot drive while taking narcotics.  No lifting or carrying greater than 10 lbs. until further directed by your surgeon. Avoid periods of inactivity such as sitting longer than an hour when not asleep. This helps prevent blood clots.  You may return to work once you are authorized by your doctor.     WEIGHT BEARING   Weight bearing as tolerated with assist device (walker, cane, etc) as directed, use it as long as suggested by your surgeon or therapist, typically at least 4-6 weeks.   EXERCISES  Results after joint replacement surgery are often greatly improved when you follow the exercise, range of motion and muscle strengthening exercises prescribed by your doctor. Safety measures are also important to protect the joint from further injury. Any time any of these exercises cause you to have increased pain or swelling, decrease what you are doing until you are comfortable again and then slowly increase them. If you have  problems or  questions, call your caregiver or physical therapist for advice.   Rehabilitation is important following a joint replacement. After just a few days of immobilization, the muscles of the leg can become weakened and shrink (atrophy).  These exercises are designed to build up the tone and strength of the thigh and leg muscles and to improve motion. Often times heat used for twenty to thirty minutes before working out will loosen up your tissues and help with improving the range of motion but do not use heat for the first two weeks following surgery (sometimes heat can increase post-operative swelling).   These exercises can be done on a training (exercise) mat, on the floor, on a table or on a bed. Use whatever works the best and is most comfortable for you.    Use music or television while you are exercising so that the exercises are a pleasant break in your day. This will make your life better with the exercises acting as a break in your routine that you can look forward to.   Perform all exercises about fifteen times, three times per day or as directed.  You should exercise both the operative leg and the other leg as well.  Exercises include:   Quad Sets - Tighten up the muscle on the front of the thigh (Quad) and hold for 5-10 seconds.   Straight Leg Raises - With your knee straight (if you were given a brace, keep it on), lift the leg to 60 degrees, hold for 3 seconds, and slowly lower the leg.  Perform this exercise against resistance later as your leg gets stronger.  Leg Slides: Lying on your back, slowly slide your foot toward your buttocks, bending your knee up off the floor (only go as far as is comfortable). Then slowly slide your foot back down until your leg is flat on the floor again.  Angel Wings: Lying on your back spread your legs to the side as far apart as you can without causing discomfort.  Hamstring Strength:  Lying on your back, push your heel against the floor with your  leg straight by tightening up the muscles of your buttocks.  Repeat, but this time bend your knee to a comfortable angle, and push your heel against the floor.  You may put a pillow under the heel to make it more comfortable if necessary.   A rehabilitation program following joint replacement surgery can speed recovery and prevent re-injury in the future due to weakened muscles. Contact your doctor or a physical therapist for more information on knee rehabilitation.    CONSTIPATION  Constipation is defined medically as fewer than three stools per week and severe constipation as less than one stool per week.  Even if you have a regular bowel pattern at home, your normal regimen is likely to be disrupted due to multiple reasons following surgery.  Combination of anesthesia, postoperative narcotics, change in appetite and fluid intake all can affect your bowels.   YOU MUST use at least one of the following options; they are listed in order of increasing strength to get the job done.  They are all available over the counter, and you may need to use some, POSSIBLY even all of these options:    Drink plenty of fluids (prune juice may be helpful) and high fiber foods Colace 100 mg by mouth twice a day  Senokot for constipation as directed and as needed Dulcolax (bisacodyl), take with full glass of water  Miralax (polyethylene glycol) once  or twice a day as needed.  If you have tried all these things and are unable to have a bowel movement in the first 3-4 days after surgery call either your surgeon or your primary doctor.    If you experience loose stools or diarrhea, hold the medications until you stool forms back up.  If your symptoms do not get better within 1 week or if they get worse, check with your doctor.  If you experience "the worst abdominal pain ever" or develop nausea or vomiting, please contact the office immediately for further recommendations for treatment.   ITCHING:  If you experience  itching with your medications, try taking only a single pain pill, or even half a pain pill at a time.  You can also use Benadryl over the counter for itching or also to help with sleep.   TED HOSE STOCKINGS:  Use stockings on both legs until for at least 2 weeks or as directed by physician office. They may be removed at night for sleeping.  MEDICATIONS:  See your medication summary on the "After Visit Summary" that nursing will review with you.  You may have some home medications which will be placed on hold until you complete the course of blood thinner medication.  It is important for you to complete the blood thinner medication as prescribed.   Blood clot prevention (DVT Prophylaxis): After surgery you are at an increased risk for a blood clot. you were prescribed a blood thinner, Aspirin 81mg , to be taken twice daily for a total of 4 weeks from surgery to help reduce your risk of getting a blood clot. This will help prevent a blood clot. Signs of a pulmonary embolus (blood clot in the lungs) include sudden short of breath, feeling lightheaded or dizzy, chest pain with a deep breath, rapid pulse rapid breathing. Signs of a blood clot in your arms or legs include new unexplained swelling and cramping, warm, red or darkened skin around the painful area. Please call the office or 911 right away if these signs or symptoms develop.  PRECAUTIONS:  If you experience chest pain or shortness of breath - call 911 immediately for transfer to the hospital emergency department.   If you develop a fever greater that 101 F, purulent drainage from wound, increased redness or drainage from wound, foul odor from the wound/dressing, or calf pain - CONTACT YOUR SURGEON.                                                   FOLLOW-UP APPOINTMENTS:  If you do not already have a post-op appointment, please call the office for an appointment to be seen by your surgeon.  Guidelines for how soon to be seen are listed in your  "After Visit Summary", but are typically between 2-3 weeks after surgery.  OTHER INSTRUCTIONS:   Knee Replacement/Revision:  Do not place pillow under knee, focus on keeping the knee straight while resting. CPM instructions: 0-90 degrees, 2 hours in the morning, 2 hours in the afternoon, and 2 hours in the evening. Place foam block, curve side up under heel at all times except when in CPM or when walking.  DO NOT modify, tear, cut, or change the foam block in any way.  POST-OPERATIVE OPIOID TAPER INSTRUCTIONS: It is important to wean off of your opioid medication as  soon as possible. If you do not need pain medication after your surgery it is ok to stop day one. Opioids include: Codeine, Hydrocodone(Norco, Vicodin), Oxycodone(Percocet, oxycontin) and hydromorphone amongst others.  Long term and even short term use of opiods can cause: Increased pain response Dependence Constipation Depression Respiratory depression And more.  Withdrawal symptoms can include Flu like symptoms Nausea, vomiting And more Techniques to manage these symptoms Hydrate well Eat regular healthy meals Stay active Use relaxation techniques(deep breathing, meditating, yoga) Do Not substitute Alcohol to help with tapering If you have been on opioids for less than two weeks and do not have pain than it is ok to stop all together.  Plan to wean off of opioids This plan should start within one week post op of your joint replacement. Maintain the same interval or time between taking each dose and first decrease the dose.  Cut the total daily intake of opioids by one tablet each day Next start to increase the time between doses. The last dose that should be eliminated is the evening dose.   MAKE SURE YOU:  Understand these instructions.  Get help right away if you are not doing well or get worse.    Thank you for letting us be a part of your medical care team.  It is a privilege we respect greatly.  We hope  these instructions will help you stay on track for a fast and full recovery!            Signed: Oralia Criger A Jolane Bankhead 11/06/2022, 7:08 AM

## 2022-11-06 NOTE — Plan of Care (Signed)
  Problem: Activity: Goal: Ability to avoid complications of mobility impairment will improve Outcome: Progressing Goal: Range of joint motion will improve Outcome: Progressing   Problem: Pain Management: Goal: Pain level will decrease with appropriate interventions Outcome: Progressing   Problem: Safety: Goal: Ability to remain free from injury will improve Outcome: Progressing   

## 2022-11-06 NOTE — Progress Notes (Signed)
     Subjective:  Patient reports pain as mild.  Did well with PT yesterday. Discussed importance of keeping the knee in full extension when in bed. Denies distal n/t. No new issues.  Objective:   VITALS:   Vitals:   11/05/22 1617 11/05/22 2047 11/06/22 0159 11/06/22 0508  BP: 112/80 (!) 96/59 (!) 97/55 (!) 100/54  Pulse: 62 (!) 54 (!) 55 (!) 54  Resp: 16 16 16 16   Temp: 98 F (36.7 C) 98 F (36.7 C) 98.1 F (36.7 C) 98.1 F (36.7 C)  TempSrc: Axillary     SpO2: 98% 100% 98% 100%  Weight:      Height:        Sensation intact distally Intact pulses distally Dorsiflexion/Plantar flexion intact Incision: dressing C/D/I Compartment soft    Lab Results  Component Value Date   WBC 7.9 11/06/2022   HGB 12.1 (L) 11/06/2022   HCT 37.9 (L) 11/06/2022   MCV 92.7 11/06/2022   PLT 229 11/06/2022   BMET    Component Value Date/Time   NA 136 11/06/2022 0412   NA 141 10/05/2022 1045   K 4.1 11/06/2022 0412   CL 105 11/06/2022 0412   CO2 24 11/06/2022 0412   GLUCOSE 102 (H) 11/06/2022 0412   BUN 20 11/06/2022 0412   BUN 19 10/05/2022 1045   CREATININE 0.92 11/06/2022 0412   CALCIUM 7.9 (L) 11/06/2022 0412   EGFR 81 10/05/2022 1045   GFRNONAA >60 11/06/2022 0412      Xray: Revision components in good position, no adverse features.  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 Blanchie Dessert at [facility].  Address: [address], [place], [place] [address]  Office Phone: [phone]    Joen Laura 11/06/2022, 7:06 AM   Weber Cooks, MD  Contact information:   [phone]  7am-5pm epic message Dr. Blanchie Dessert, or call office for patient follow up: [phone] After hours and holidays please check [email or web address] for group call information for [facility]

## 2022-11-07 ENCOUNTER — Encounter (HOSPITAL_COMMUNITY): Payer: Self-pay | Admitting: Orthopedic Surgery

## 2022-11-12 ENCOUNTER — Other Ambulatory Visit: Payer: Self-pay | Admitting: Internal Medicine

## 2022-11-12 DIAGNOSIS — E7800 Pure hypercholesterolemia, unspecified: Secondary | ICD-10-CM

## 2022-12-11 ENCOUNTER — Encounter (HOSPITAL_BASED_OUTPATIENT_CLINIC_OR_DEPARTMENT_OTHER): Payer: Self-pay | Admitting: Orthopedic Surgery

## 2022-12-11 NOTE — Progress Notes (Signed)
Spoke w/ via phone for pre-op interview--- pt Lab needs dos----    [employer] results------ current EKG in epic/ chart COVID test -----patient states asymptomatic no test needed Arrive at ------- 1045 on 12-17-2022 NPO after MN NO Solid Food.  Clear liquids from MN until--- 0945 Med rec completed Medications to take morning of surgery ----- none Diabetic medication ----- do not take metformin, farxiga, actos morning of surgery Patient instructed no nail polish to be worn day of surgery Patient instructed to bring photo id and insurance card day of surgery Patient aware to have Driver (ride ) / caregiver    for 24 hours after surgery -- wife, vickie Patient Special Instructions ----- n/a Pre-Op special Instructions ----- case just added on today, pre-op orders pending Patient verbalized understanding of instructions that were given at this phone interview. Patient denies shortness of breath, chest pain, fever, cough at this phone interview.

## 2022-12-16 ENCOUNTER — Ambulatory Visit: Payer: Self-pay | Admitting: Emergency Medicine

## 2022-12-17 ENCOUNTER — Ambulatory Visit (HOSPITAL_BASED_OUTPATIENT_CLINIC_OR_DEPARTMENT_OTHER): Payer: BC Managed Care – PPO | Admitting: Anesthesiology

## 2022-12-17 ENCOUNTER — Other Ambulatory Visit: Payer: Self-pay

## 2022-12-17 ENCOUNTER — Encounter (HOSPITAL_BASED_OUTPATIENT_CLINIC_OR_DEPARTMENT_OTHER): Payer: Self-pay | Admitting: Orthopedic Surgery

## 2022-12-17 ENCOUNTER — Ambulatory Visit (HOSPITAL_BASED_OUTPATIENT_CLINIC_OR_DEPARTMENT_OTHER)
Admission: RE | Admit: 2022-12-17 | Discharge: 2022-12-17 | Disposition: A | Discharge status: Home / Self Care | Payer: BC Managed Care – PPO | Attending: Orthopedic Surgery | Admitting: Orthopedic Surgery

## 2022-12-17 ENCOUNTER — Encounter (HOSPITAL_BASED_OUTPATIENT_CLINIC_OR_DEPARTMENT_OTHER)
Admission: RE | Disposition: A | Discharge status: Home / Self Care | Payer: Self-pay | Source: Home / Self Care | Attending: Orthopedic Surgery

## 2022-12-17 DIAGNOSIS — Z01818 Encounter for other preprocedural examination: Secondary | ICD-10-CM

## 2022-12-17 DIAGNOSIS — I10 Essential (primary) hypertension: Secondary | ICD-10-CM | POA: Insufficient documentation

## 2022-12-17 DIAGNOSIS — M24661 Ankylosis, right knee: Secondary | ICD-10-CM | POA: Insufficient documentation

## 2022-12-17 DIAGNOSIS — Z96651 Presence of right artificial knee joint: Secondary | ICD-10-CM | POA: Insufficient documentation

## 2022-12-17 DIAGNOSIS — E119 Type 2 diabetes mellitus without complications: Secondary | ICD-10-CM | POA: Insufficient documentation

## 2022-12-17 HISTORY — DX: Benign prostatic hyperplasia with lower urinary tract symptoms: N40.1

## 2022-12-17 HISTORY — DX: Type 2 diabetes mellitus without complications: E11.9

## 2022-12-17 HISTORY — PX: KNEE CLOSED REDUCTION: SHX995

## 2022-12-17 HISTORY — DX: Hyperlipidemia, unspecified: E78.5

## 2022-12-17 HISTORY — DX: Ankylosis, right knee: M24.661

## 2022-12-17 HISTORY — DX: Diverticulosis of large intestine without perforation or abscess without bleeding: K57.30

## 2022-12-17 HISTORY — DX: Unspecified osteoarthritis, unspecified site: M19.90

## 2022-12-17 LAB — POCT I-STAT, CHEM 8
BUN: 22 mg/dL (ref 8–23)
Calcium, Ion: 1.24 mmol/L (ref 1.15–1.40)
Chloride: 105 mmol/L (ref 98–111)
Creatinine, Ser: 0.9 mg/dL (ref 0.61–1.24)
Glucose, Bld: 112 mg/dL — ABNORMAL HIGH (ref 70–99)
HCT: 37 % — ABNORMAL LOW (ref 39.0–52.0)
Hemoglobin: 12.6 g/dL — ABNORMAL LOW (ref 13.0–17.0)
Potassium: 4 mmol/L (ref 3.5–5.1)
Sodium: 140 mmol/L (ref 135–145)
TCO2: 25 mmol/L (ref 22–32)

## 2022-12-17 SURGERY — MANIPULATION, KNEE, CLOSED
Anesthesia: General | Site: Knee | Laterality: Right

## 2022-12-17 MED ORDER — EPHEDRINE SULFATE-NACL 50-0.9 MG/10ML-% IV SOSY
PREFILLED_SYRINGE | INTRAVENOUS | Status: DC | PRN
  Administered 2022-12-17: 10 mg via INTRAVENOUS

## 2022-12-17 MED ORDER — OXYCODONE HCL 5 MG PO TABS: 5.0000 mg | ORAL_TABLET | Freq: Once | ORAL | Status: DC | PRN

## 2022-12-17 MED ORDER — ONDANSETRON HCL 4 MG/2ML IJ SOLN
INTRAMUSCULAR | Status: DC | PRN
  Administered 2022-12-17: 4 mg via INTRAVENOUS

## 2022-12-17 MED ORDER — PROPOFOL 10 MG/ML IV BOLUS
INTRAVENOUS | Status: AC
  Filled 2022-12-17: qty 20

## 2022-12-17 MED ORDER — FENTANYL CITRATE (PF) 100 MCG/2ML IJ SOLN
INTRAMUSCULAR | Status: AC
  Filled 2022-12-17: qty 2

## 2022-12-17 MED ORDER — MIDAZOLAM HCL 2 MG/2ML IJ SOLN
INTRAMUSCULAR | Status: AC
  Filled 2022-12-17: qty 2

## 2022-12-17 MED ORDER — SODIUM CHLORIDE 0.9 % IV SOLN: INTRAVENOUS | Status: DC

## 2022-12-17 MED ORDER — PROPOFOL 10 MG/ML IV BOLUS
INTRAVENOUS | Status: DC | PRN
  Administered 2022-12-17: 160 mg via INTRAVENOUS

## 2022-12-17 MED ORDER — SUCCINYLCHOLINE CHLORIDE 200 MG/10ML IV SOSY
PREFILLED_SYRINGE | INTRAVENOUS | Status: DC | PRN
  Administered 2022-12-17: 40 mg via INTRAVENOUS

## 2022-12-17 MED ORDER — DROPERIDOL 2.5 MG/ML IJ SOLN: 0.6250 mg | Freq: Once | INTRAMUSCULAR | Status: DC | PRN

## 2022-12-17 MED ORDER — METHOCARBAMOL 500 MG PO TABS: 500.0000 mg | ORAL_TABLET | Freq: Three times a day (TID) | ORAL | 0 refills | Status: AC | PRN

## 2022-12-17 MED ORDER — FENTANYL CITRATE (PF) 250 MCG/5ML IJ SOLN
INTRAMUSCULAR | Status: DC | PRN
  Administered 2022-12-17: 50 ug via INTRAVENOUS

## 2022-12-17 MED ORDER — OXYCODONE HCL 5 MG/5ML PO SOLN: 5.0000 mg | Freq: Once | ORAL | Status: DC | PRN

## 2022-12-17 MED ORDER — ACETAMINOPHEN 10 MG/ML IV SOLN: 1000.0000 mg | Freq: Once | INTRAVENOUS | Status: DC | PRN

## 2022-12-17 MED ORDER — PHENYLEPHRINE HCL-NACL 20-0.9 MG/250ML-% IV SOLN
INTRAVENOUS | Status: DC | PRN
  Administered 2022-12-17: 160 ug via INTRAVENOUS

## 2022-12-17 MED ORDER — PHENYLEPHRINE HCL-NACL 20-0.9 MG/250ML-% IV SOLN: INTRAVENOUS | Status: DC | PRN

## 2022-12-17 MED ORDER — TRAMADOL HCL 50 MG PO TABS: 50.0000 mg | ORAL_TABLET | Freq: Three times a day (TID) | ORAL | 0 refills | Status: AC | PRN

## 2022-12-17 MED ORDER — MIDAZOLAM HCL 2 MG/2ML IJ SOLN
INTRAMUSCULAR | Status: DC | PRN
  Administered 2022-12-17: 2 mg via INTRAVENOUS

## 2022-12-17 MED ORDER — FENTANYL CITRATE (PF) 100 MCG/2ML IJ SOLN: 25.0000 ug | INTRAMUSCULAR | Status: DC | PRN

## 2022-12-17 MED ORDER — LIDOCAINE 2% (20 MG/ML) 5 ML SYRINGE
INTRAMUSCULAR | Status: DC | PRN
  Administered 2022-12-17: 80 mg via INTRAVENOUS

## 2022-12-17 NOTE — Op Note (Addendum)
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   The risks benefits and alternatives were discussed with the patient preoperatively including but not limited to the risks of infection, bleeding, nerve injury, cardiopulmonary complications, the need for revision surgery, among others, and the patient was willing to proceed.  ESTIMATED BLOOD LOSS: 0cc  OPERATIVE IMPLANTS: none  OPERATIVE FINDINGS: Improved range of motion 5 to 85 before manipulation and 5 to 110  after the manipulation  OPERATIVE PROCEDURE:  The patient was brought to the operating room.  Anesthesia was induced.  With the patient's supine on the stretcher the knee was assessed and found to be stable to varus valgus stress had full extension but flexion was limited to approximately 85 degrees.   With the hip flexed 90 degrees the knee was gently manipulated by putting pressure at the proximal tibia.  Small crackles were audibly heard as the adhesions were disrupted.  Once the manipulations was completed the range of motion was found to be 5 to 110.  The patient was awoken from anesthesia and taken to the recovery room in stable condition.  Post op recs: WB: WBAT Abx:not indicted Imaging: none Dressing: none DVT prophylaxis: not indicated Follow up: 2 weeks with Steven Buck at [facility].  Address: [address], [place], [place] [address]   Office Phone: [phone]   Steven Cooks, MD Orthopaedic Surgery

## 2022-12-17 NOTE — Discharge Instructions (Addendum)
Orthopedic Discharge Instructions  Diet: As you were doing prior to hospitalization   Dressing:  none  Activity:  Increase activity slowly as tolerated, but follow the weight bearing instructions below.  The rules on driving is that you can not be taking narcotics while you drive, and you must feel in control of the vehicle.    Weight Bearing:   weight bearing as tolerated. Work aggressively on range of motion.  To prevent constipation: you may use a stool softener such as -  Colace (over the counter) 100 mg by mouth twice a day  Drink plenty of fluids (prune juice may be helpful) and high fiber foods Miralax (over the counter) for constipation as needed.    Itching:  If you experience itching with your medications, try taking only a single pain pill, or even half a pain pill at a time.  You may take up to 10 pain pills per day, and you can also use benadryl over the counter for itching or also to help with sleep.   Precautions:  If you experience chest pain or shortness of breath - call 911 immediately for transfer to the hospital emergency department!!   Call office [phone]) for the following: Temperature greater than 101F Persistent nausea and vomiting Severe uncontrolled pain Redness, tenderness, or signs of infection (pain, swelling, redness, odor or green/yellow discharge around the site) Difficulty breathing, headache or visual disturbances Hives Persistent dizziness or light-headedness Extreme fatigue Any other questions or concerns you may have after discharge  In an emergency, call 911 or go to an Emergency Department at a nearby hospital  Follow- Up Appointment:  Please call for an appointment to be seen approximately 2 weeks after surgery in [facility] with your surgeon Dr. Weber Cooks - [phone] Address: [address], [place], [place] [address]   Post Anesthesia Home Care Instructions  Activity: Get plenty of rest for the remainder of the  day. A responsible individual must stay with you for 24 hours following the procedure.  For the next 24 hours, DO NOT: -Drive a car -Advertising copywriter -Drink alcoholic beverages -Take any medication unless instructed by your physician -Make any legal decisions or sign important papers.  Meals: Start with liquid foods such as gelatin or soup. Progress to regular foods as tolerated. Avoid greasy, spicy, heavy foods. If nausea and/or vomiting occur, drink only clear liquids until the nausea and/or vomiting subsides. Call your physician if vomiting continues.  Special Instructions/Symptoms: Your throat may feel dry or sore from the anesthesia or the breathing tube placed in your throat during surgery. If this causes discomfort, gargle with warm salt water. The discomfort should disappear within 24 hours.  If you had a scopolamine patch placed behind your ear for the management of post- operative nausea and/or vomiting:  1. The medication in the patch is effective for 72 hours, after which it should be removed.  Wrap patch in a tissue and discard in the trash. Wash hands thoroughly with soap and water. 2. You may remove the patch earlier than 72 hours if you experience unpleasant side effects which may include dry mouth, dizziness or visual disturbances. 3. Avoid touching the patch. Wash your hands with soap and water after contact with the patch.

## 2022-12-17 NOTE — Anesthesia Preprocedure Evaluation (Signed)
Anesthesia Evaluation  Patient identified by MRN, date of birth, ID band Patient awake    Reviewed: Allergy & Precautions, H&P , NPO status , Patient's Chart, lab work & pertinent test results  History of Anesthesia Complications Negative for: history of anesthetic complications  Airway Mallampati: II  TM Distance: >3 FB Neck ROM: Full    Dental no notable dental hx.    Pulmonary neg pulmonary ROS   Pulmonary exam normal breath sounds clear to auscultation       Cardiovascular hypertension, negative cardio ROS Normal cardiovascular exam Rhythm:Regular Rate:Normal     Neuro/Psych negative neurological ROS  negative psych ROS   GI/Hepatic negative GI ROS, Neg liver ROS,,,  Endo/Other  diabetes    Renal/GU negative Renal ROS  negative genitourinary   Musculoskeletal negative musculoskeletal ROS (+) Arthritis ,  S/p total knee arthroplasty    Abdominal   Peds negative pediatric ROS (+)  Hematology negative hematology ROS (+)   Anesthesia Other Findings   Reproductive/Obstetrics negative OB ROS                             Anesthesia Physical Anesthesia Plan  ASA: 2  Anesthesia Plan: General   Post-op Pain Management:    Induction: Intravenous  PONV Risk Score and Plan:   Airway Management Planned: LMA  Additional Equipment:   Intra-op Plan:   Post-operative Plan: Extubation in OR  Informed Consent: I have reviewed the patients History and Physical, chart, labs and discussed the procedure including the risks, benefits and alternatives for the proposed anesthesia with the patient or authorized representative who has indicated his/her understanding and acceptance.     Dental advisory given  Plan Discussed with: CRNA  Anesthesia Plan Comments:        Anesthesia Quick Evaluation

## 2022-12-17 NOTE — Transfer of Care (Signed)
Immediate Anesthesia Transfer of Care Note  Patient: Steven Buck  Procedure(s) Performed: CLOSED MANIPULATION KNEE (Right: Knee)  Patient Location: PACU  Anesthesia Type:General  Level of Consciousness: sedated  Airway & Oxygen Therapy: Patient Spontanous Breathing  Post-op Assessment: Report given to RN and Post -op Vital signs reviewed and stable  Post vital signs: Reviewed and stable  Last Vitals:  Vitals Value Taken Time  BP 118/81 12/17/22 1119  Temp    Pulse 77 12/17/22 1123  Resp 13 12/17/22 1123  SpO2 96 % 12/17/22 1123  Vitals shown include unfiled device data.  Last Pain:  Vitals:   12/17/22 1048  TempSrc: Oral  PainSc: 0-No pain         Complications: No notable events documented.

## 2022-12-17 NOTE — Anesthesia Postprocedure Evaluation (Signed)
Anesthesia Post Note  Patient: Steven Buck  Procedure(s) Performed: CLOSED MANIPULATION KNEE (Right: Knee)     Patient location during evaluation: PACU Anesthesia Type: General Level of consciousness: awake and alert Pain management: pain level controlled Vital Signs Assessment: post-procedure vital signs reviewed and stable Respiratory status: spontaneous breathing, nonlabored ventilation, respiratory function stable and patient connected to nasal cannula oxygen Cardiovascular status: blood pressure returned to baseline and stable Postop Assessment: no apparent nausea or vomiting Anesthetic complications: no   No notable events documented.  Last Vitals:  Vitals:   12/17/22 1119 12/17/22 1130  BP: 118/81 111/83  Pulse: 75 72  Resp: 14 14  Temp: 36.6 C   SpO2: 98% 97%    Last Pain:  Vitals:   12/17/22 1130  TempSrc:   PainSc: 0-No pain                 [place]

## 2022-12-17 NOTE — H&P (Signed)
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 male who Underwent revision total knee arthroplasty for arthrofibrosis on 11/05/2022.  His preoperative motion before that procedure was 20 to 75 degrees.  He has been working hard with physical therapy and has done well regaining range of motion but has plateaued the last couple of weeks at about 5 to 85 degrees.  No other concerns or signs of infection.  Past Medical History:  Diagnosis Date   Ankylosis of right knee    s/p revision TKA 11-05-2022   Benign localized prostatic hyperplasia with lower urinary tract symptoms (LUTS)    Diverticulosis of colon    Hyperlipidemia    OA (osteoarthritis)    Type 2 diabetes mellitus (HCC)    followed by pcp   (12-11-2022  per pt does not check blood sugar)   Past Surgical History:  Procedure Laterality Date   COLONOSCOPY W/ BIOPSIES  2016   TOTAL KNEE ARTHROPLASTY Right 10/19/2020   @[facility]  by dr Thurston Hole   TOTAL KNEE REVISION Right 11/05/2022   Procedure: TOTAL KNEE REVISION;  Surgeon: Joen Laura, MD;  Location: [facility];  Service: Orthopedics;  Laterality: Right;   Social History   Socioeconomic History   Marital status: Married    Spouse name: Not on file   Number of children: Not on file   Years of education: Not on file   Highest education level: Not on file  Occupational History   Occupation: Janitor  Tobacco Use   Smoking status: Never   Smokeless tobacco: Never  Vaping Use   Vaping status: Never Used  Substance and Sexual Activity   Alcohol use: Yes    Comment: occasional beer   Drug use: Never   Sexual activity: Yes  Other Topics Concern   Not on file  Social History Narrative   Not on file   Social Determinants of Health   Financial Resource Strain: Not on file  Food Insecurity: No Food Insecurity (11/05/2022)   Hunger Vital Sign    Worried About  Running Out of Food in the Last Year: Never true    Ran Out of Food in the Last Year: Never true  Transportation Needs: No Transportation Needs (11/05/2022)   PRAPARE - Administrator, Civil Service (Medical): No    Lack of Transportation (Non-Medical): No  Physical Activity: Not on file  Stress: Not on file  Social Connections: Not on file   History reviewed. No pertinent family history. No Known Allergies   Positive ROS: All other systems have been reviewed and were otherwise negative with the exception of those mentioned in the HPI and as above.  Physical Exam: General: Alert, no acute distress Cardiovascular: No pedal edema Respiratory: No cyanosis, no use of accessory musculature Skin: No lesions in the area of chief complaint Neurologic: Sensation intact distally Psychiatric: Patient is competent for consent with normal mood and affect  MUSCULOSKELETAL:  RLE well-healed incision  Moderate expected postoperative swelling  Range of motion approximately 5 to 85 degrees  No groin pain with log roll  No knee or ankle effusion  Knee stable to varus/ valgus stress  Sens DPN, SPN, TN intact  Motor EHL, ext, flex 5/5  DP 2+, PT 2+, No significant edema  Assessment: 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  Risk and benefits of manipulation including wound complication, fracture, pain were discussed.  Patient expresses understanding  with plan to proceed with manipulation under anesthesia today.    Joen Laura, MD  Contact information:   [email or web address] 7am-5pm epic message Dr. Blanchie Dessert, or call office for patient follow up: [phone] After hours and holidays please check [email or web address] for group call information for [facility]

## 2022-12-17 NOTE — Anesthesia Procedure Notes (Signed)
Procedure Name: LMA Insertion Date/Time: 12/17/2022 11:00 AM  Performed by: Dairl Ponder, CRNAPre-anesthesia Checklist: Patient identified, Emergency Drugs available, Suction available and Patient being monitored Patient Re-evaluated:Patient Re-evaluated prior to induction Oxygen Delivery Method: Circle System Utilized Preoxygenation: Pre-oxygenation with 100% oxygen Induction Type: IV induction Ventilation: Mask ventilation without difficulty LMA: LMA inserted LMA Size: 4.0 Number of attempts: 1 Airway Equipment and Method: Bite block Placement Confirmation: positive ETCO2 Tube secured with: Tape Dental Injury: Teeth and Oropharynx as per pre-operative assessment

## 2022-12-18 ENCOUNTER — Encounter (HOSPITAL_BASED_OUTPATIENT_CLINIC_OR_DEPARTMENT_OTHER): Payer: Self-pay | Admitting: Orthopedic Surgery

## 2023-01-03 ENCOUNTER — Other Ambulatory Visit: Payer: Self-pay | Admitting: Internal Medicine

## 2023-01-03 DIAGNOSIS — E119 Type 2 diabetes mellitus without complications: Secondary | ICD-10-CM

## 2023-01-11 ENCOUNTER — Other Ambulatory Visit: Payer: BC Managed Care – PPO

## 2023-01-11 ENCOUNTER — Other Ambulatory Visit: Payer: Self-pay

## 2023-01-11 ENCOUNTER — Ambulatory Visit: Payer: BC Managed Care – PPO | Admitting: Internal Medicine

## 2023-01-11 DIAGNOSIS — E7800 Pure hypercholesterolemia, unspecified: Secondary | ICD-10-CM

## 2023-01-11 DIAGNOSIS — E119 Type 2 diabetes mellitus without complications: Secondary | ICD-10-CM

## 2023-01-11 DIAGNOSIS — N400 Enlarged prostate without lower urinary tract symptoms: Secondary | ICD-10-CM

## 2023-01-11 DIAGNOSIS — I10 Essential (primary) hypertension: Secondary | ICD-10-CM

## 2023-01-12 LAB — CBC WITH DIFF/PLATELET
Basophils Absolute: 0.1 10*3/uL (ref 0.0–0.2)
Basos: 2 %
EOS (ABSOLUTE): 0.3 10*3/uL (ref 0.0–0.4)
Eos: 6 %
Hematocrit: 43.8 % (ref 37.5–51.0)
Hemoglobin: 14 g/dL (ref 13.0–17.7)
Immature Grans (Abs): 0 10*3/uL (ref 0.0–0.1)
Immature Granulocytes: 0 %
Lymphocytes Absolute: 1.6 10*3/uL (ref 0.7–3.1)
Lymphs: 33 %
MCH: 29.8 pg (ref 26.6–33.0)
MCHC: 32 g/dL (ref 31.5–35.7)
MCV: 93 fL (ref 79–97)
Monocytes Absolute: 0.4 10*3/uL (ref 0.1–0.9)
Monocytes: 9 %
Neutrophils Absolute: 2.5 10*3/uL (ref 1.4–7.0)
Neutrophils: 50 %
Platelets: 364 10*3/uL (ref 150–450)
RBC: 4.7 x10E6/uL (ref 4.14–5.80)
RDW: 12.8 % (ref 11.6–15.4)
WBC: 4.9 10*3/uL (ref 3.4–10.8)

## 2023-01-12 LAB — HEMOGLOBIN A1C
Est. average glucose Bld gHb Est-mCnc: 143 mg/dL
Hgb A1c MFr Bld: 6.6 % — ABNORMAL HIGH (ref 4.8–5.6)

## 2023-01-12 LAB — COMPREHENSIVE METABOLIC PANEL
ALT: 31 IU/L (ref 0–44)
AST: 26 IU/L (ref 0–40)
Albumin: 3.8 g/dL — ABNORMAL LOW (ref 3.9–4.9)
Alkaline Phosphatase: 72 IU/L (ref 44–121)
BUN/Creatinine Ratio: 14 (ref 10–24)
BUN: 14 mg/dL (ref 8–27)
Bilirubin Total: 0.5 mg/dL (ref 0.0–1.2)
CO2: 26 mmol/L (ref 20–29)
Calcium: 9.3 mg/dL (ref 8.6–10.2)
Chloride: 104 mmol/L (ref 96–106)
Creatinine, Ser: 1 mg/dL (ref 0.76–1.27)
Globulin, Total: 2.6 g/dL (ref 1.5–4.5)
Glucose: 107 mg/dL — ABNORMAL HIGH (ref 70–99)
Potassium: 4.8 mmol/L (ref 3.5–5.2)
Sodium: 141 mmol/L (ref 134–144)
Total Protein: 6.4 g/dL (ref 6.0–8.5)
eGFR: 81 mL/min/{1.73_m2} (ref >59)

## 2023-01-12 LAB — LIPID PANEL
Chol/HDL Ratio: 2.1 ratio (ref 0.0–5.0)
Cholesterol, Total: 147 mg/dL (ref 100–199)
HDL: 71 mg/dL (ref >39)
LDL Chol Calc (NIH): 65 mg/dL (ref 0–99)
Triglycerides: 49 mg/dL (ref 0–149)
VLDL Cholesterol Cal: 11 mg/dL (ref 5–40)

## 2023-01-12 LAB — PSA: Prostate Specific Ag, Serum: 1.9 ng/mL (ref 0.0–4.0)

## 2023-01-14 ENCOUNTER — Encounter: Payer: Self-pay | Admitting: Internal Medicine

## 2023-01-14 ENCOUNTER — Ambulatory Visit (INDEPENDENT_AMBULATORY_CARE_PROVIDER_SITE_OTHER): Payer: BC Managed Care – PPO | Admitting: Internal Medicine

## 2023-01-14 VITALS — BP 115/85 | HR 50 | Ht 72.0 in | Wt 200.6 lb

## 2023-01-14 DIAGNOSIS — Z1331 Encounter for screening for depression: Secondary | ICD-10-CM

## 2023-01-14 DIAGNOSIS — E119 Type 2 diabetes mellitus without complications: Secondary | ICD-10-CM

## 2023-01-14 DIAGNOSIS — I10 Essential (primary) hypertension: Secondary | ICD-10-CM

## 2023-01-14 DIAGNOSIS — E7800 Pure hypercholesterolemia, unspecified: Secondary | ICD-10-CM

## 2023-01-14 LAB — POCT CBG (FASTING - GLUCOSE)-MANUAL ENTRY: Glucose Fasting, POC: 107 mg/dL — AB (ref 70–99)

## 2023-01-14 NOTE — Progress Notes (Signed)
Established Patient Office Visit  Subjective:  Patient ID: Steven Buck, male    DOB: Jun 04, 1950  Age: 72 y.o. MRN: [id number]  Chief Complaint  Patient presents with   Medicare Wellness    Wellness visit    No new complaints, here for AWV refer to quality metrics and scanned documents. Labs reviewed and notable for well controlled diabetes, A1c slightly higher but remains at target, lipids at target with unremarkable cmp. Denies any hypoglycemic episodes and home bg readings have been at target.   No other concerns at this time.   Past Medical History:  Diagnosis Date   Ankylosis of right knee    Steven Buck/p revision TKA 11-05-2022   Benign localized prostatic hyperplasia with lower urinary tract symptoms (LUTS)    Diverticulosis of colon    Hyperlipidemia    OA (osteoarthritis)    Type 2 diabetes mellitus (HCC)    followed by pcp   (12-11-2022  per pt does not check blood sugar)    Past Surgical History:  Procedure Laterality Date   COLONOSCOPY W/ BIOPSIES  2016   KNEE CLOSED REDUCTION Right 12/17/2022   Procedure: CLOSED MANIPULATION KNEE;  Surgeon: Steven Laura, MD;  Location: [facility];  Service: Orthopedics;  Laterality: Right;   TOTAL KNEE ARTHROPLASTY Right 10/19/2020   @[facility]  by dr Steven Buck   TOTAL KNEE REVISION Right 11/05/2022   Procedure: TOTAL KNEE REVISION;  Surgeon: Steven Laura, MD;  Location: [facility];  Service: Orthopedics;  Laterality: Right;    Social History   Socioeconomic History   Marital status: Married    Spouse name: Not on file   Number of children: Not on file   Years of education: Not on file   Highest education level: Not on file  Occupational History   Occupation: Janitor  Tobacco Use   Smoking status: Never   Smokeless tobacco: Never  Vaping Use   Vaping status: Never Used  Substance and Sexual Activity   Alcohol use: Yes    Comment: occasional beer   Drug use: Never   Sexual activity: Yes  Other Topics  Concern   Not on file  Social History Narrative   Not on file   Social Determinants of Health   Financial Resource Strain: Not on file  Food Insecurity: No Food Insecurity (11/05/2022)   Hunger Vital Sign    Worried About Running Out of Food in the Last Year: Never true    Ran Out of Food in the Last Year: Never true  Transportation Needs: No Transportation Needs (11/05/2022)   PRAPARE - Administrator, Civil Service (Medical): No    Lack of Transportation (Non-Medical): No  Physical Activity: Not on file  Stress: Not on file  Social Connections: Not on file  Intimate Partner Violence: Not At Risk (11/05/2022)   Humiliation, Afraid, Rape, and Kick questionnaire    Fear of Current or Ex-Partner: No    Emotionally Abused: No    Physically Abused: No    Sexually Abused: No    No family history on file.  No Known Allergies  Review of Systems  Constitutional: Negative.   HENT: Negative.    Eyes: Negative.   Respiratory: Negative.    Cardiovascular: Negative.   Gastrointestinal: Negative.   Genitourinary:  Positive for frequency (occasional nocturia).       Delayed ejaculation  Musculoskeletal:  Positive for joint pain.       Right knee arthralgia  Skin: Negative.  Neurological: Negative.  Negative for dizziness and headaches.  Endo/Heme/Allergies: Negative.   Psychiatric/Behavioral: Negative.         Objective:   BP 115/85   Pulse (!) 50   Ht 6' (1.829 m)   Wt 200 lb 9.6 oz (91 kg)   SpO2 97%   BMI 27.21 kg/m   Vitals:   01/14/23 1057  BP: 115/85  Pulse: (!) 50  Height: 6' (1.829 m)  Weight: 200 lb 9.6 oz (91 kg)  SpO2: 97%  BMI (Calculated): 27.2    Physical Exam Vitals reviewed.  Constitutional:      Appearance: Normal appearance.  HENT:     Head: Normocephalic.     Left Ear: There is no impacted cerumen.     Nose: Nose normal.     Mouth/Throat:     Mouth: Mucous membranes are moist.     Pharynx: No posterior oropharyngeal erythema.   Eyes:     Extraocular Movements: Extraocular movements intact.     Pupils: Pupils are equal, round, and reactive to light.  Cardiovascular:     Rate and Rhythm: Regular rhythm.     Chest Wall: PMI is not displaced.     Pulses: Normal pulses.     Heart sounds: Normal heart sounds. No murmur heard. Pulmonary:     Effort: Pulmonary effort is normal.     Breath sounds: Normal air entry. No rhonchi or rales.  Abdominal:     General: Abdomen is flat. Bowel sounds are normal. There is no distension.     Palpations: Abdomen is soft. There is no hepatomegaly, splenomegaly or mass.     Tenderness: There is no abdominal tenderness.  Musculoskeletal:     Cervical back: Normal range of motion and neck supple.     Right lower leg: No edema.     Left lower leg: No edema.     Comments: Limping gait  Skin:    General: Skin is warm and dry.  Neurological:     General: No focal deficit present.     Mental Status: He is alert and oriented to person, place, and time.     Cranial Nerves: No cranial nerve deficit.     Motor: No weakness.  Psychiatric:        Mood and Affect: Mood normal.        Behavior: Behavior normal.    Results for orders placed or performed in visit on 01/14/23  POCT CBG (Fasting - Glucose)  Result Value Ref Range   Glucose Fasting, POC 107 (A) 70 - 99 mg/dL    Recent Results (from the past 2160 hour(Steven Buck))  Glucose, capillary     Status: Abnormal   Collection Time: 10/26/22  1:27 PM  Result Value Ref Range   Glucose-Capillary 104 (H) 70 - 99 mg/dL    Comment: Glucose reference range applies only to samples taken after fasting for at least 8 hours.  CBC WITH DIFFERENTIAL     Status: None   Collection Time: 10/26/22  1:38 PM  Result Value Ref Range   WBC 5.9 4.0 - 10.5 K/uL   RBC 4.89 4.22 - 5.81 MIL/uL   Hemoglobin 14.6 13.0 - 17.0 g/dL   HCT 16.1 09.6 - 04.5 %   MCV 90.4 80.0 - 100.0 fL   MCH 29.9 26.0 - 34.0 pg   MCHC 33.0 30.0 - 36.0 g/dL   RDW 40.9 81.1 - 91.4  %   Platelets 311 150 - 400 K/uL  nRBC 0.0 0.0 - 0.2 %   Neutrophils Relative % 55 %   Neutro Abs 3.3 1.7 - 7.7 K/uL   Lymphocytes Relative 30 %   Lymphs Abs 1.8 0.7 - 4.0 K/uL   Monocytes Relative 10 %   Monocytes Absolute 0.6 0.1 - 1.0 K/uL   Eosinophils Relative 2 %   Eosinophils Absolute 0.1 0.0 - 0.5 K/uL   Basophils Relative 2 %   Basophils Absolute 0.1 0.0 - 0.1 K/uL   Immature Granulocytes 1 %   Abs Immature Granulocytes 0.03 0.00 - 0.07 K/uL    Comment: Performed at [facility], [address], [place], [place] [address]  Comprehensive metabolic panel     Status: None   Collection Time: 10/26/22  1:38 PM  Result Value Ref Range   Sodium 136 135 - 145 mmol/L   Potassium 4.2 3.5 - 5.1 mmol/L   Chloride 104 98 - 111 mmol/L   CO2 25 22 - 32 mmol/L   Glucose, Bld 94 70 - 99 mg/dL    Comment: Glucose reference range applies only to samples taken after fasting for at least 8 hours.   BUN 22 8 - 23 mg/dL   Creatinine, Ser 4.03 0.61 - 1.24 mg/dL   Calcium 8.9 8.9 - 47.4 mg/dL   Total Protein 7.7 6.5 - 8.1 g/dL   Albumin 4.0 3.5 - 5.0 g/dL   AST 33 15 - 41 U/L   ALT 22 0 - 44 U/L   Alkaline Phosphatase 51 38 - 126 U/L   Total Bilirubin 1.2 0.3 - 1.2 mg/dL   GFR, Estimated >25 >95 mL/min    Comment: (NOTE) Calculated using the CKD-EPI Creatinine Equation (2021)    Anion gap 7 5 - 15    Comment: Performed at [facility], [address], [place], [place] [address]  Type and screen Order type and screen if day of surgery is less than 15 days from draw of preadmission visit or order morning of surgery if day of surgery is greater than 6 days from preadmission visit.     Status: None   Collection Time: 10/26/22  1:38 PM  Result Value Ref Range   ABO/RH(D) A POS    Antibody Screen NEG    Sample Expiration 11/08/2022,2359    Extend sample reason      NO TRANSFUSIONS OR PREGNANCY IN THE PAST 3 MONTHS Performed at [facility], [address] [place], [place] [address]   Surgical pcr screen     Status: None   Collection Time: 10/26/22  2:39 PM   Specimen: Nasal Mucosa; Nasal Swab  Result Value Ref Range   MRSA, PCR NEGATIVE NEGATIVE   Staphylococcus aureus NEGATIVE NEGATIVE    Comment: (NOTE) The Xpert SA Assay (FDA approved for NASAL specimens in patients 63 years of age and older), is one component of a comprehensive surveillance program. It is not intended to diagnose infection nor to guide or monitor treatment. Performed at [facility], [address], [place], [place] [address]   Glucose, capillary     Status: Abnormal   Collection Time: 11/05/22  5:57 AM  Result Value Ref Range   Glucose-Capillary 111 (H) 70 - 99 mg/dL    Comment: Glucose reference range applies only to samples taken after fasting for at least 8 hours.  ABO/Rh     Status: None   Collection Time: 11/05/22  6:23 AM  Result Value Ref Range  ABO/RH(D)      A POS Performed at [facility], [address] [place], [place] [address]   Glucose, capillary     Status: Abnormal   Collection Time: 11/05/22 11:49 AM  Result Value Ref Range   Glucose-Capillary 131 (H) 70 - 99 mg/dL    Comment: Glucose reference range applies only to samples taken after fasting for at least 8 hours.  CBC     Status: Abnormal   Collection Time: 11/06/22  4:12 AM  Result Value Ref Range   WBC 7.9 4.0 - 10.5 K/uL   RBC 4.09 (L) 4.22 - 5.81 MIL/uL   Hemoglobin 12.1 (L) 13.0 - 17.0 g/dL   HCT 95.2 (L) 84.1 - 32.4 %   MCV 92.7 80.0 - 100.0 fL   MCH 29.6 26.0 - 34.0 pg   MCHC 31.9 30.0 - 36.0 g/dL   RDW 40.1 02.7 - 25.3 %   Platelets 229 150 - 400 K/uL   nRBC 0.0 0.0 - 0.2 %    Comment: Performed at [facility], [address] [place], [place] [address]  Basic metabolic panel     Status: Abnormal   Collection Time: 11/06/22  4:12 AM  Result Value Ref Range   Sodium 136 135 - 145  mmol/L   Potassium 4.1 3.5 - 5.1 mmol/L   Chloride 105 98 - 111 mmol/L   CO2 24 22 - 32 mmol/L   Glucose, Bld 102 (H) 70 - 99 mg/dL    Comment: Glucose reference range applies only to samples taken after fasting for at least 8 hours.   BUN 20 8 - 23 mg/dL   Creatinine, Ser 3.47 0.61 - 1.24 mg/dL   Calcium 7.9 (L) 8.9 - 10.3 mg/dL   GFR, Estimated >42 >59 mL/min    Comment: (NOTE) Calculated using the CKD-EPI Creatinine Equation (2021)    Anion gap 7 5 - 15    Comment: Performed at [facility], [address], [place], [place] [address]  I-STAT, [place] [address]     Status: Abnormal   Collection Time: 12/17/22 10:45 AM  Result Value Ref Range   Sodium 140 135 - 145 mmol/L   Potassium 4.0 3.5 - 5.1 mmol/L   Chloride 105 98 - 111 mmol/L   BUN 22 8 - 23 mg/dL   Creatinine, Ser 5.64 0.61 - 1.24 mg/dL   Glucose, Bld 332 (H) 70 - 99 mg/dL    Comment: Glucose reference range applies only to samples taken after fasting for at least 8 hours.   Calcium, Ion 1.24 1.15 - 1.40 mmol/L   TCO2 25 22 - 32 mmol/L   Hemoglobin 12.6 (L) 13.0 - 17.0 g/dL   HCT 95.1 (L) 88.4 - 16.6 %  PSA     Status: None   Collection Time: 01/11/23  9:51 AM  Result Value Ref Range   Prostate Specific Ag, Serum 1.9 0.0 - 4.0 ng/mL    Comment: Roche ECLIA methodology. According to the American Urological Association, Serum PSA should decrease and remain at undetectable levels after radical prostatectomy. The AUA defines biochemical recurrence as an initial PSA value 0.2 ng/mL or greater followed by a subsequent confirmatory PSA value 0.2 ng/mL or greater. Values obtained with different assay methods or kits cannot be used interchangeably. Results cannot be interpreted as absolute evidence of the presence or absence of malignant disease.   CBC With Diff/Platelet     Status: None   Collection Time: 01/11/23  9:51 AM  Result Value Ref Range   WBC 4.9 3.4 - 10.8 x10E3/uL   RBC 4.70 4.14 - 5.80 x10E6/uL    Hemoglobin 14.0 13.0 - 17.7 g/dL   Hematocrit 32.9 51.8 - 51.0 %   MCV 93 79 - 97 fL   MCH 29.8 26.6 - 33.0 pg   MCHC 32.0 31.5 - 35.7 g/dL   RDW 84.1 66.0 - 63.0 %   Platelets 364 150 - 450 x10E3/uL   Neutrophils 50 Not Estab. %   Lymphs 33 Not Estab. %   Monocytes 9 Not Estab. %   Eos 6 Not Estab. %   Basos 2 Not Estab. %   Neutrophils Absolute 2.5 1.4 - 7.0 x10E3/uL   Lymphocytes Absolute 1.6 0.7 - 3.1 x10E3/uL   Monocytes Absolute 0.4 0.1 - 0.9 x10E3/uL   EOS (ABSOLUTE) 0.3 0.0 - 0.4 x10E3/uL   Basophils Absolute 0.1 0.0 - 0.2 x10E3/uL   Immature Granulocytes 0 Not Estab. %   Immature Grans (Abs) 0.0 0.0 - 0.1 x10E3/uL  Comprehensive metabolic panel     Status: Abnormal   Collection Time: 01/11/23  9:51 AM  Result Value Ref Range   Glucose 107 (H) 70 - 99 mg/dL   BUN 14 8 - 27 mg/dL   Creatinine, Ser 1.60 0.76 - 1.27 mg/dL   eGFR 81 >10 XN/ATF/5.73   BUN/Creatinine Ratio 14 10 - 24   Sodium 141 134 - 144 mmol/L   Potassium 4.8 3.5 - 5.2 mmol/L   Chloride 104 96 - 106 mmol/L   CO2 26 20 - 29 mmol/L   Calcium 9.3 8.6 - 10.2 mg/dL   Total Protein 6.4 6.0 - 8.5 g/dL   Albumin 3.8 (L) 3.9 - 4.9 g/dL   Globulin, Total 2.6 1.5 - 4.5 g/dL   Bilirubin Total 0.5 0.0 - 1.2 mg/dL   Alkaline Phosphatase 72 44 - 121 IU/L   AST 26 0 - 40 IU/L   ALT 31 0 - 44 IU/L  Lipid panel     Status: None   Collection Time: 01/11/23  9:51 AM  Result Value Ref Range   Cholesterol, Total 147 100 - 199 mg/dL   Triglycerides 49 0 - 149 mg/dL   HDL 71 >22 mg/dL   VLDL Cholesterol Cal 11 5 - 40 mg/dL   LDL Chol Calc (NIH) 65 0 - 99 mg/dL   Chol/HDL Ratio 2.1 0.0 - 5.0 ratio    Comment:                                   T. Chol/HDL Ratio                                             Men  Women                               1/2 Avg.Risk  3.4    3.3                                   Avg.Risk  5.0    4.4  2X Avg.Risk  9.6    7.1                                3X  Avg.Risk 23.4   11.0   Hemoglobin A1c     Status: Abnormal   Collection Time: 01/11/23  9:51 AM  Result Value Ref Range   Hgb A1c MFr Bld 6.6 (H) 4.8 - 5.6 %    Comment:          Prediabetes: 5.7 - 6.4          Diabetes: >6.4          Glycemic control for adults with diabetes: <7.0    Est. average glucose Bld gHb Est-mCnc 143 mg/dL  POCT CBG (Fasting - Glucose)     Status: Abnormal   Collection Time: 01/14/23 11:04 AM  Result Value Ref Range   Glucose Fasting, POC 107 (A) 70 - 99 mg/dL      Assessment & Plan:  As per problem list. The current medical regimen is effective;  continue present plan and medications.  Problem List Items Addressed This Visit       Endocrine   Type 2 diabetes mellitus without complication, without long-term current use of insulin (HCC) - Primary   Relevant Orders   POCT CBG (Fasting - Glucose) (Completed)    Return in about 3 months (around 04/15/2023) for fu with labs prior.   Total time spent: 20 minutes  Luna Fuse, MD  01/14/2023   This document may have been prepared by [employer] Voice Recognition software and as such may include unintentional dictation errors.

## 2023-03-31 ENCOUNTER — Other Ambulatory Visit: Payer: Self-pay | Admitting: Internal Medicine

## 2023-03-31 DIAGNOSIS — E119 Type 2 diabetes mellitus without complications: Secondary | ICD-10-CM

## 2023-04-11 ENCOUNTER — Other Ambulatory Visit: Payer: BC Managed Care – PPO

## 2023-04-12 LAB — LIPID PANEL
Chol/HDL Ratio: 2.1 {ratio} (ref 0.0–5.0)
Cholesterol, Total: 137 mg/dL (ref 100–199)
HDL: 64 mg/dL (ref >39)
LDL Chol Calc (NIH): 63 mg/dL (ref 0–99)
Triglycerides: 44 mg/dL (ref 0–149)
VLDL Cholesterol Cal: 10 mg/dL (ref 5–40)

## 2023-04-12 LAB — HEMOGLOBIN A1C
Est. average glucose Bld gHb Est-mCnc: 140 mg/dL
Hgb A1c MFr Bld: 6.5 % — ABNORMAL HIGH (ref 4.8–5.6)

## 2023-04-15 ENCOUNTER — Encounter: Payer: Self-pay | Admitting: Internal Medicine

## 2023-04-15 ENCOUNTER — Ambulatory Visit (INDEPENDENT_AMBULATORY_CARE_PROVIDER_SITE_OTHER): Payer: BC Managed Care – PPO | Admitting: Internal Medicine

## 2023-04-15 ENCOUNTER — Ambulatory Visit: Payer: BC Managed Care – PPO | Admitting: Internal Medicine

## 2023-04-15 VITALS — BP 132/86 | HR 45 | Ht 72.0 in | Wt 193.8 lb

## 2023-04-15 DIAGNOSIS — R634 Abnormal weight loss: Secondary | ICD-10-CM

## 2023-04-15 DIAGNOSIS — E119 Type 2 diabetes mellitus without complications: Secondary | ICD-10-CM | POA: Diagnosis not present

## 2023-04-15 DIAGNOSIS — Z0130 Encounter for examination of blood pressure without abnormal findings: Secondary | ICD-10-CM

## 2023-04-15 DIAGNOSIS — E7800 Pure hypercholesterolemia, unspecified: Secondary | ICD-10-CM | POA: Diagnosis not present

## 2023-04-15 LAB — POC CREATINE & ALBUMIN,URINE
Albumin/Creatinine Ratio, Urine, POC: <30
Creatinine, POC: 50 mg/dL
Microalbumin Ur, POC: 10 mg/L

## 2023-04-15 LAB — POCT CBG (FASTING - GLUCOSE)-MANUAL ENTRY: Glucose Fasting, POC: 112 mg/dL — AB (ref 70–99)

## 2023-04-15 MED ORDER — METFORMIN HCL ER 500 MG PO TB24: 500.0000 mg | ORAL_TABLET | Freq: Every day | ORAL | 0 refills | Status: DC

## 2023-04-15 MED ORDER — METFORMIN HCL ER 500 MG PO TB24: 500.0000 mg | ORAL_TABLET | Freq: Every day | ORAL | 2 refills | Status: DC

## 2023-04-15 NOTE — Progress Notes (Signed)
Established Patient Office Visit  Subjective:  Patient ID: Steven Buck, male    DOB: June 26, 1950  Age: 72 y.o. MRN: [id number]  Chief Complaint  Patient presents with   Follow-up    3 month follow up, discuss lab results.    No new complaints, here for lab review and medication refills. Labs reviewed and notable for well controlled diabetes, A1c at target, lipids also at target with unremarkable cmp. Hasn't checked his home bg recently.    No other concerns at this time.   Past Medical History:  Diagnosis Date   Ankylosis of right knee    Imer Foxworth/p revision TKA 11-05-2022   Benign localized prostatic hyperplasia with lower urinary tract symptoms (LUTS)    Diverticulosis of colon    Hyperlipidemia    OA (osteoarthritis)    Type 2 diabetes mellitus (HCC)    followed by pcp   (12-11-2022  per pt does not check blood sugar)    Past Surgical History:  Procedure Laterality Date   COLONOSCOPY W/ BIOPSIES  2016   KNEE CLOSED REDUCTION Right 12/17/2022   Procedure: CLOSED MANIPULATION KNEE;  Surgeon: Joen Laura, MD;  Location: [facility];  Service: Orthopedics;  Laterality: Right;   TOTAL KNEE ARTHROPLASTY Right 10/19/2020   @[facility]  by dr Thurston Hole   TOTAL KNEE REVISION Right 11/05/2022   Procedure: TOTAL KNEE REVISION;  Surgeon: Joen Laura, MD;  Location: [facility];  Service: Orthopedics;  Laterality: Right;    Social History   Socioeconomic History   Marital status: Married    Spouse name: Not on file   Number of children: Not on file   Years of education: Not on file   Highest education level: Not on file  Occupational History   Occupation: Janitor  Tobacco Use   Smoking status: Never   Smokeless tobacco: Never  Vaping Use   Vaping status: Never Used  Substance and Sexual Activity   Alcohol use: Yes    Comment: occasional beer   Drug use: Never   Sexual activity: Yes  Other Topics Concern   Not on file  Social History Narrative   Not  on file   Social Drivers of Health   Financial Resource Strain: Not on file  Food Insecurity: No Food Insecurity (11/05/2022)   Hunger Vital Sign    Worried About Running Out of Food in the Last Year: Never true    Ran Out of Food in the Last Year: Never true  Transportation Needs: No Transportation Needs (11/05/2022)   PRAPARE - Administrator, Civil Service (Medical): No    Lack of Transportation (Non-Medical): No  Physical Activity: Not on file  Stress: Not on file  Social Connections: Not on file  Intimate Partner Violence: Not At Risk (11/05/2022)   Humiliation, Afraid, Rape, and Kick questionnaire    Fear of Current or Ex-Partner: No    Emotionally Abused: No    Physically Abused: No    Sexually Abused: No    No family history on file.  No Known Allergies  Outpatient Medications Prior to Visit  Medication Sig   atorvastatin (LIPITOR) 10 MG tablet Take 1 tablet (10 mg total) by mouth at bedtime.   Cinnamon 500 MG TABS Take 500 mg by mouth every evening.   pioglitazone (ACTOS) 30 MG tablet TAKE 1 TABLET BY MOUTH EVERY DAY   [DISCONTINUED] metFORMIN (GLUCOPHAGE-XR) 750 MG 24 hr tablet TAKE 1 TABLET BY MOUTH EVERY DAY  No facility-administered medications prior to visit.    Review of Systems  Constitutional:  Positive for weight loss (7 lbs).  HENT: Negative.    Eyes: Negative.   Respiratory: Negative.    Cardiovascular: Negative.   Gastrointestinal: Negative.   Genitourinary:  Positive for frequency (occasional nocturia).       Delayed ejaculation  Musculoskeletal:  Positive for joint pain.       Right knee arthralgia  Skin: Negative.   Neurological: Negative.  Negative for dizziness and headaches.  Endo/Heme/Allergies: Negative.   Psychiatric/Behavioral: Negative.         Objective:   BP 132/86   Pulse (!) 45   Ht 6' (1.829 m)   Wt 193 lb 12.8 oz (87.9 kg)   SpO2 98%   BMI 26.28 kg/m   Vitals:   04/15/23 0956  BP: 132/86  Pulse: (!) 45   Height: 6' (1.829 m)  Weight: 193 lb 12.8 oz (87.9 kg)  SpO2: 98%  BMI (Calculated): 26.28    Physical Exam Vitals reviewed.  Constitutional:      Appearance: Normal appearance.  HENT:     Head: Normocephalic.     Left Ear: There is no impacted cerumen.     Nose: Nose normal.     Mouth/Throat:     Mouth: Mucous membranes are moist.     Pharynx: No posterior oropharyngeal erythema.  Eyes:     Extraocular Movements: Extraocular movements intact.     Pupils: Pupils are equal, round, and reactive to light.  Cardiovascular:     Rate and Rhythm: Regular rhythm.     Chest Wall: PMI is not displaced.     Pulses: Normal pulses.     Heart sounds: Normal heart sounds. No murmur heard. Pulmonary:     Effort: Pulmonary effort is normal.     Breath sounds: Normal air entry. No rhonchi or rales.  Abdominal:     General: Abdomen is flat. Bowel sounds are normal. There is no distension.     Palpations: Abdomen is soft. There is no hepatomegaly, splenomegaly or mass.     Tenderness: There is no abdominal tenderness.  Musculoskeletal:     Cervical back: Normal range of motion and neck supple.     Right lower leg: No edema.     Left lower leg: No edema.     Comments: Limping gait  Skin:    General: Skin is warm and dry.  Neurological:     General: No focal deficit present.     Mental Status: He is alert and oriented to person, place, and time.     Cranial Nerves: No cranial nerve deficit.     Motor: No weakness.  Psychiatric:        Mood and Affect: Mood normal.        Behavior: Behavior normal.      Results for orders placed or performed in visit on 04/15/23  POCT CBG (Fasting - Glucose)  Result Value Ref Range   Glucose Fasting, POC 112 (A) 70 - 99 mg/dL    Recent Results (from the past 2160 hours)  Lipid panel     Status: None   Collection Time: 04/11/23 10:37 AM  Result Value Ref Range   Cholesterol, Total 137 100 - 199 mg/dL   Triglycerides 44 0 - 149 mg/dL   HDL 64  >60 mg/dL   VLDL Cholesterol Cal 10 5 - 40 mg/dL   LDL Chol Calc (NIH) 63 0 - 99 mg/dL  Chol/HDL Ratio 2.1 0.0 - 5.0 ratio    Comment:                                   T. Chol/HDL Ratio                                             Men  Women                               1/2 Avg.Risk  3.4    3.3                                   Avg.Risk  5.0    4.4                                2X Avg.Risk  9.6    7.1                                3X Avg.Risk 23.4   11.0   Hemoglobin A1c     Status: Abnormal   Collection Time: 04/11/23 10:37 AM  Result Value Ref Range   Hgb A1c MFr Bld 6.5 (H) 4.8 - 5.6 %    Comment:          Prediabetes: 5.7 - 6.4          Diabetes: >6.4          Glycemic control for adults with diabetes: <7.0    Est. average glucose Bld gHb Est-mCnc 140 mg/dL  POCT CBG (Fasting - Glucose)     Status: Abnormal   Collection Time: 04/15/23 10:01 AM  Result Value Ref Range   Glucose Fasting, POC 112 (A) 70 - 99 mg/dL      Assessment & Plan:  As per problem list. Decrease metfomin.  Problem List Items Addressed This Visit       Endocrine   Type 2 diabetes mellitus without complication, without long-term current use of insulin (HCC) - Primary   Relevant Medications   metFORMIN (GLUCOPHAGE-XR) 500 MG 24 hr tablet   Other Relevant Orders   POCT CBG (Fasting - Glucose) (Completed)     Other   Pure hypercholesterolemia   Other Visit Diagnoses       Weight loss       Relevant Orders   CBC With Diff/Platelet   Comprehensive metabolic panel       Return in about 3 months (around 07/14/2023) for fu with labs prior.   Total time spent: 20 minutes  Luna Fuse, MD  04/15/2023   This document may have been prepared by [facility] Voice Recognition software and as such may include unintentional dictation errors.

## 2023-04-26 ENCOUNTER — Other Ambulatory Visit (INDEPENDENT_AMBULATORY_CARE_PROVIDER_SITE_OTHER): Payer: Self-pay

## 2023-04-26 ENCOUNTER — Other Ambulatory Visit: Payer: Self-pay

## 2023-04-26 DIAGNOSIS — Z139 Encounter for screening, unspecified: Secondary | ICD-10-CM

## 2023-04-26 DIAGNOSIS — Z1211 Encounter for screening for malignant neoplasm of colon: Secondary | ICD-10-CM

## 2023-04-26 LAB — POC HEMOCCULT BLD/STL (HOME/3-CARD/SCREEN)
Card #2 Fecal Occult Blod, POC: NEGATIVE
Card #3 Fecal Occult Blood, POC: NEGATIVE
Fecal Occult Blood, POC: NEGATIVE

## 2023-07-02 ENCOUNTER — Other Ambulatory Visit: Payer: Self-pay | Admitting: Internal Medicine

## 2023-07-02 DIAGNOSIS — E7800 Pure hypercholesterolemia, unspecified: Secondary | ICD-10-CM

## 2023-07-12 ENCOUNTER — Other Ambulatory Visit: Payer: Self-pay | Admitting: Internal Medicine

## 2023-07-12 ENCOUNTER — Other Ambulatory Visit

## 2023-07-13 LAB — HEMOGLOBIN A1C
Est. average glucose Bld gHb Est-mCnc: 143 mg/dL
Hgb A1c MFr Bld: 6.6 % — ABNORMAL HIGH (ref 4.8–5.6)

## 2023-07-13 LAB — LIPID PANEL
Chol/HDL Ratio: 2.1 ratio (ref 0.0–5.0)
Cholesterol, Total: 141 mg/dL (ref 100–199)
HDL: 67 mg/dL (ref >39)
LDL Chol Calc (NIH): 64 mg/dL (ref 0–99)
Triglycerides: 41 mg/dL (ref 0–149)
VLDL Cholesterol Cal: 10 mg/dL (ref 5–40)

## 2023-07-16 ENCOUNTER — Ambulatory Visit (INDEPENDENT_AMBULATORY_CARE_PROVIDER_SITE_OTHER): Payer: 59 | Admitting: Internal Medicine

## 2023-07-16 ENCOUNTER — Encounter: Payer: Self-pay | Admitting: Internal Medicine

## 2023-07-16 VITALS — BP 121/73 | HR 51 | Temp 97.7°F | Ht 72.0 in | Wt 200.0 lb

## 2023-07-16 DIAGNOSIS — E119 Type 2 diabetes mellitus without complications: Secondary | ICD-10-CM | POA: Diagnosis not present

## 2023-07-16 DIAGNOSIS — Z0130 Encounter for examination of blood pressure without abnormal findings: Secondary | ICD-10-CM

## 2023-07-16 DIAGNOSIS — R634 Abnormal weight loss: Secondary | ICD-10-CM | POA: Diagnosis not present

## 2023-07-16 LAB — POCT CBG (FASTING - GLUCOSE)-MANUAL ENTRY: Glucose Fasting, POC: 108 mg/dL — AB (ref 70–99)

## 2023-07-16 MED ORDER — METFORMIN HCL ER 500 MG PO TB24: 500.0000 mg | ORAL_TABLET | Freq: Every day | ORAL | 0 refills | Status: DC

## 2023-07-16 MED ORDER — PIOGLITAZONE HCL 30 MG PO TABS: 30.0000 mg | ORAL_TABLET | Freq: Every day | ORAL | 0 refills | Status: DC

## 2023-07-16 NOTE — Progress Notes (Signed)
 Established Patient Office Visit  Subjective:  Patient ID: Steven Buck, male    DOB: [birth year]  Age: 73 y.o. MRN: [id number]  Chief Complaint  Patient presents with   Follow-up    3 month follow up  Labs done prior     No new complaints, here for lab review and medication refills. Labs reviewed and notable for well controlled diabetes, A1c higher but remains at target, lipids at target with unremarkable cmp. Denies any hypoglycemic episodes and home bg readings have been at target. Has not taken metformin in 3 months due to pharmacy error.     No other concerns at this time.   Past Medical History:  Diagnosis Date   Ankylosis of right knee    Steven Buck/p revision TKA 11-05-2022   Benign localized prostatic hyperplasia with lower urinary tract symptoms (LUTS)    Diverticulosis of colon    Hyperlipidemia    OA (osteoarthritis)    Type 2 diabetes mellitus (HCC)    followed by pcp   (12-11-2022  per pt does not check blood sugar)    Past Surgical History:  Procedure Laterality Date   COLONOSCOPY W/ BIOPSIES  2016   KNEE CLOSED REDUCTION Right 12/17/2022   Procedure: CLOSED MANIPULATION KNEE;  Surgeon: Joen Laura, MD;  Location: [facility] [place];  Service: Orthopedics;  Laterality: Right;   TOTAL KNEE ARTHROPLASTY Right 10/19/2020   @[facility]  by dr Thurston Hole   TOTAL KNEE REVISION Right 11/05/2022   Procedure: TOTAL KNEE REVISION;  Surgeon: Joen Laura, MD;  Location: [facility];  Service: Orthopedics;  Laterality: Right;    Social History   Socioeconomic History   Marital status: Married    Spouse name: Not on file   Number of children: Not on file   Years of education: Not on file   Highest education level: Not on file  Occupational History   Occupation: Janitor  Tobacco Use   Smoking status: Never   Smokeless tobacco: Never  Vaping Use   Vaping status: Never Used  Substance and Sexual Activity   Alcohol use: Yes    Comment: occasional beer    Drug use: Never   Sexual activity: Yes  Other Topics Concern   Not on file  Social History Narrative   Not on file   Social Drivers of Health   Financial Resource Strain: Not on file  Food Insecurity: No Food Insecurity (11/05/2022)   Hunger Vital Sign    Worried About Running Out of Food in the Last Year: Never true    Ran Out of Food in the Last Year: Never true  Transportation Needs: No Transportation Needs (11/05/2022)   PRAPARE - Administrator, Civil Service (Medical): No    Lack of Transportation (Non-Medical): No  Physical Activity: Not on file  Stress: Not on file  Social Connections: Not on file  Intimate Partner Violence: Not At Risk (11/05/2022)   Humiliation, Afraid, Rape, and Kick questionnaire    Fear of Current or Ex-Partner: No    Emotionally Abused: No    Physically Abused: No    Sexually Abused: No    No family history on file.  No Known Allergies  Outpatient Medications Prior to Visit  Medication Sig   atorvastatin (LIPITOR) 10 MG tablet TAKE 1 TABLET BY MOUTH EVERYDAY AT BEDTIME   pioglitazone (ACTOS) 30 MG tablet TAKE 1 TABLET BY MOUTH EVERY DAY   Cinnamon 500 MG TABS Take 500 mg by mouth every  evening.   metFORMIN (GLUCOPHAGE-XR) 500 MG 24 hr tablet Take 1 tablet (500 mg total) by mouth daily with breakfast.   No facility-administered medications prior to visit.    Review of Systems  Constitutional:  Negative for weight loss (gained 7 lbs).  HENT: Negative.    Eyes: Negative.   Respiratory: Negative.    Cardiovascular: Negative.   Gastrointestinal: Negative.   Genitourinary:  Positive for frequency (occasional nocturia).       Delayed ejaculation  Musculoskeletal:  Positive for joint pain.       Right knee arthralgia  Skin: Negative.   Neurological: Negative.  Negative for dizziness and headaches.  Endo/Heme/Allergies: Negative.   Psychiatric/Behavioral: Negative.         Objective:   BP 121/73   Pulse (!) 51   Temp 97.7 F  (36.5 C)   Ht 6' (1.829 m)   Wt 200 lb (90.7 kg)   SpO2 98%   BMI 27.12 kg/m   Vitals:   07/16/23 0953  BP: 121/73  Pulse: (!) 51  Temp: 97.7 F (36.5 C)  Height: 6' (1.829 m)  Weight: 200 lb (90.7 kg)  SpO2: 98%  BMI (Calculated): 27.12    Physical Exam Vitals reviewed.  Constitutional:      Appearance: Normal appearance.  HENT:     Head: Normocephalic.     Left Ear: There is no impacted cerumen.     Nose: Nose normal.     Mouth/Throat:     Mouth: Mucous membranes are moist.     Pharynx: No posterior oropharyngeal erythema.  Eyes:     Extraocular Movements: Extraocular movements intact.     Pupils: Pupils are equal, round, and reactive to light.  Cardiovascular:     Rate and Rhythm: Regular rhythm.     Chest Wall: PMI is not displaced.     Pulses: Normal pulses.     Heart sounds: Normal heart sounds. No murmur heard. Pulmonary:     Effort: Pulmonary effort is normal.     Breath sounds: Normal air entry. No rhonchi or rales.  Abdominal:     General: Abdomen is flat. Bowel sounds are normal. There is no distension.     Palpations: Abdomen is soft. There is no hepatomegaly, splenomegaly or mass.     Tenderness: There is no abdominal tenderness.  Musculoskeletal:     Cervical back: Normal range of motion and neck supple.     Right lower leg: No edema.     Left lower leg: No edema.     Comments: Limping gait  Skin:    General: Skin is warm and dry.  Neurological:     General: No focal deficit present.     Mental Status: He is alert and oriented to person, place, and time.     Cranial Nerves: No cranial nerve deficit.     Motor: No weakness.  Psychiatric:        Mood and Affect: Mood normal.        Behavior: Behavior normal.      Results for orders placed or performed in visit on 07/16/23  POCT CBG (Fasting - Glucose)  Result Value Ref Range   Glucose Fasting, POC 108 (A) 70 - 99 mg/dL    Recent Results (from the past 2160 hours)  POC Hemoccult  Bld/Stl (3-Cd Home Screen)     Status: Normal   Collection Time: 04/26/23  3:28 PM  Result Value Ref Range   Card #1 Date 04/16/2023  Fecal Occult Blood, POC Negative Negative   Card #2 Date 04/18/2023    Card #2 Fecal Occult Blod, POC Negative    Card #3 Date 04/21/2023    Card #3 Fecal Occult Blood, POC Negative   Lipid panel     Status: None   Collection Time: 07/12/23 10:08 AM  Result Value Ref Range   Cholesterol, Total 141 100 - 199 mg/dL   Triglycerides 41 0 - 149 mg/dL   HDL 67 >16 mg/dL   VLDL Cholesterol Cal 10 5 - 40 mg/dL   LDL Chol Calc (NIH) 64 0 - 99 mg/dL   Chol/HDL Ratio 2.1 0.0 - 5.0 ratio    Comment:                                   T. Chol/HDL Ratio                                             Men  Women                               1/2 Avg.Risk  3.4    3.3                                   Avg.Risk  5.0    4.4                                2X Avg.Risk  9.6    7.1                                3X Avg.Risk 23.4   11.0   Hemoglobin A1c     Status: Abnormal   Collection Time: 07/12/23 10:08 AM  Result Value Ref Range   Hgb A1c MFr Bld 6.6 (H) 4.8 - 5.6 %    Comment:          Prediabetes: 5.7 - 6.4          Diabetes: >6.4          Glycemic control for adults with diabetes: <7.0    Est. average glucose Bld gHb Est-mCnc 143 mg/dL  POCT CBG (Fasting - Glucose)     Status: Abnormal   Collection Time: 07/16/23  9:59 AM  Result Value Ref Range   Glucose Fasting, POC 108 (A) 70 - 99 mg/dL      Assessment & Plan:  As per problem list. Resume Metformin. Problem List Items Addressed This Visit       Endocrine   Type 2 diabetes mellitus without complication, without long-term current use of insulin (HCC) - Primary   Relevant Orders   POCT CBG (Fasting - Glucose) (Completed)    Return in about 3 months (around 10/16/2023).   Total time spent: 20 minutes  Luna Fuse, MD  07/16/2023   This document may have been prepared by [facility] Voice  Recognition software and as such may include unintentional dictation errors.

## 2023-08-30 ENCOUNTER — Encounter: Payer: Self-pay | Admitting: Internal Medicine

## 2023-10-14 ENCOUNTER — Other Ambulatory Visit

## 2023-10-14 DIAGNOSIS — R634 Abnormal weight loss: Secondary | ICD-10-CM

## 2023-10-14 DIAGNOSIS — E119 Type 2 diabetes mellitus without complications: Secondary | ICD-10-CM

## 2023-10-14 LAB — CBC WITH DIFF/PLATELET
Basophils Absolute: 0.1 10*3/uL (ref 0.0–0.2)
Basos: 1 %
EOS (ABSOLUTE): 0.3 10*3/uL (ref 0.0–0.4)
Eos: 6 %
Hematocrit: 39.1 % (ref 37.5–51.0)
Hemoglobin: 12.8 g/dL — ABNORMAL LOW (ref 13.0–17.7)
Immature Grans (Abs): 0 10*3/uL (ref 0.0–0.1)
Immature Granulocytes: 0 %
Lymphocytes Absolute: 1.5 10*3/uL (ref 0.7–3.1)
Lymphs: 29 %
MCH: 30 pg (ref 26.6–33.0)
MCHC: 32.7 g/dL (ref 31.5–35.7)
MCV: 92 fL (ref 79–97)
Monocytes Absolute: 0.4 10*3/uL (ref 0.1–0.9)
Monocytes: 9 %
Neutrophils Absolute: 2.7 10*3/uL (ref 1.4–7.0)
Neutrophils: 54 %
Platelets: 301 10*3/uL (ref 150–450)
RBC: 4.26 x10E6/uL (ref 4.14–5.80)
RDW: 12.7 % (ref 11.6–15.4)
WBC: 5 10*3/uL (ref 3.4–10.8)

## 2023-10-14 LAB — HEMOGLOBIN A1C
Est. average glucose Bld gHb Est-mCnc: 137 mg/dL
Hgb A1c MFr Bld: 6.4 % — ABNORMAL HIGH (ref 4.8–5.6)

## 2023-10-16 ENCOUNTER — Ambulatory Visit: Payer: Self-pay | Admitting: Internal Medicine

## 2023-10-16 ENCOUNTER — Encounter: Payer: Self-pay | Admitting: Internal Medicine

## 2023-10-16 ENCOUNTER — Ambulatory Visit (INDEPENDENT_AMBULATORY_CARE_PROVIDER_SITE_OTHER): Admitting: Internal Medicine

## 2023-10-16 VITALS — BP 110/72 | HR 80 | Temp 98.5°F | Ht 72.0 in | Wt 201.4 lb

## 2023-10-16 DIAGNOSIS — E119 Type 2 diabetes mellitus without complications: Secondary | ICD-10-CM

## 2023-10-16 DIAGNOSIS — Z0130 Encounter for examination of blood pressure without abnormal findings: Secondary | ICD-10-CM

## 2023-10-16 LAB — POCT CBG (FASTING - GLUCOSE)-MANUAL ENTRY: Glucose Fasting, POC: 119 mg/dL — AB (ref 70–99)

## 2023-10-16 MED ORDER — PIOGLITAZONE HCL 30 MG PO TABS: 30.0000 mg | ORAL_TABLET | Freq: Every day | ORAL | 0 refills | Status: DC

## 2023-10-16 MED ORDER — METFORMIN HCL ER 500 MG PO TB24
500.0000 mg | ORAL_TABLET | Freq: Every day | ORAL | 0 refills | Status: DC
Start: 2023-10-16 — End: 2024-01-15

## 2023-10-16 NOTE — Progress Notes (Signed)
 Established Patient Office Visit  Subjective:  Patient ID: Steven Buck, male    DOB: 1951-02-17  Age: 73 y.o. MRN: [id number]  Chief Complaint  Patient presents with   Follow-up    3 month follow up    No new complaints, here for lab review and medication refills. Labs reviewed and notable for well controlled diabetes with improved A1c at target. Denies any hypoglycemic episodes and home bg readings have been at target.     No other concerns at this time.   Past Medical History:  Diagnosis Date   Ankylosis of right knee    Ryka Beighley/p revision TKA 11-05-2022   Benign localized prostatic hyperplasia with lower urinary tract symptoms (LUTS)    Diverticulosis of colon    Hyperlipidemia    OA (osteoarthritis)    Type 2 diabetes mellitus (HCC)    followed by pcp   (12-11-2022  per pt does not check blood sugar)    Past Surgical History:  Procedure Laterality Date   COLONOSCOPY W/ BIOPSIES  2016   KNEE CLOSED REDUCTION Right 12/17/2022   Procedure: CLOSED MANIPULATION KNEE;  Surgeon: Murleen Arms, MD;  Location: [facility] [place];  Service: Orthopedics;  Laterality: Right;   TOTAL KNEE ARTHROPLASTY Right 10/19/2020   @[facility]  by dr Jinger Mount   TOTAL KNEE REVISION Right 11/05/2022   Procedure: TOTAL KNEE REVISION;  Surgeon: Murleen Arms, MD;  Location: [facility];  Service: Orthopedics;  Laterality: Right;    Social History   Socioeconomic History   Marital status: Married    Spouse name: Not on file   Number of children: Not on file   Years of education: Not on file   Highest education level: Not on file  Occupational History   Occupation: Janitor  Tobacco Use   Smoking status: Never   Smokeless tobacco: Never  Vaping Use   Vaping status: Never Used  Substance and Sexual Activity   Alcohol  use: Yes    Comment: occasional beer   Drug use: Never   Sexual activity: Yes  Other Topics Concern   Not on file  Social History Narrative   Not on file    Social Drivers of Health   Financial Resource Strain: Not on file  Food Insecurity: No Food Insecurity (11/05/2022)   Hunger Vital Sign    Worried About Running Out of Food in the Last Year: Never true    Ran Out of Food in the Last Year: Never true  Transportation Needs: No Transportation Needs (11/05/2022)   PRAPARE - Administrator, Civil Service (Medical): No    Lack of Transportation (Non-Medical): No  Physical Activity: Not on file  Stress: Not on file  Social Connections: Not on file  Intimate Partner Violence: Not At Risk (11/05/2022)   Humiliation, Afraid, Rape, and Kick questionnaire    Fear of Current or Ex-Partner: No    Emotionally Abused: No    Physically Abused: No    Sexually Abused: No    No family history on file.  No Known Allergies  Outpatient Medications Prior to Visit  Medication Sig   atorvastatin  (LIPITOR) 10 MG tablet TAKE 1 TABLET BY MOUTH EVERYDAY AT BEDTIME   Cinnamon 500 MG TABS Take 500 mg by mouth every evening.   metFORMIN  (GLUCOPHAGE -XR) 500 MG 24 hr tablet Take 1 tablet (500 mg total) by mouth daily with breakfast.   pioglitazone  (ACTOS ) 30 MG tablet Take 1 tablet (30 mg total) by mouth daily.  Pseudoephedrine-Acetaminophen  (SM NON-ASPRIN SINUS PO) Take 81 1e11 Vector Genomes by mouth Nightly.   No facility-administered medications prior to visit.    Review of Systems  Constitutional:  Negative for weight loss (gained 1 lb).  HENT: Negative.    Eyes: Negative.   Respiratory: Negative.    Cardiovascular: Negative.   Gastrointestinal: Negative.   Genitourinary:  Positive for frequency (occasional nocturia).       Delayed ejaculation  Musculoskeletal:  Positive for joint pain.       Right knee arthralgia  Skin: Negative.   Neurological: Negative.  Negative for dizziness and headaches.  Endo/Heme/Allergies: Negative.   Psychiatric/Behavioral: Negative.         Objective:   BP 110/72   Pulse 80   Temp 98.5 F (36.9 C)    Ht 6' (1.829 m)   Wt 201 lb 6.4 oz (91.4 kg)   SpO2 97%   BMI 27.31 kg/m   Vitals:   10/16/23 0946  BP: 110/72  Pulse: 80  Temp: 98.5 F (36.9 C)  Height: 6' (1.829 m)  Weight: 201 lb 6.4 oz (91.4 kg)  SpO2: 97%  BMI (Calculated): 27.31    Physical Exam Vitals reviewed.  Constitutional:      Appearance: Normal appearance.  HENT:     Head: Normocephalic.     Left Ear: There is no impacted cerumen.     Nose: Nose normal.     Mouth/Throat:     Mouth: Mucous membranes are moist.     Pharynx: No posterior oropharyngeal erythema.   Eyes:     Extraocular Movements: Extraocular movements intact.     Pupils: Pupils are equal, round, and reactive to light.    Cardiovascular:     Rate and Rhythm: Regular rhythm.     Chest Wall: PMI is not displaced.     Pulses: Normal pulses.     Heart sounds: Normal heart sounds. No murmur heard. Pulmonary:     Effort: Pulmonary effort is normal.     Breath sounds: Normal air entry. No rhonchi or rales.  Abdominal:     General: Abdomen is flat. Bowel sounds are normal. There is no distension.     Palpations: Abdomen is soft. There is no hepatomegaly, splenomegaly or mass.     Tenderness: There is no abdominal tenderness.   Musculoskeletal:     Cervical back: Normal range of motion and neck supple.     Right lower leg: No edema.     Left lower leg: No edema.     Comments: Limping gait   Skin:    General: Skin is warm and dry.   Neurological:     General: No focal deficit present.     Mental Status: He is alert and oriented to person, place, and time.     Cranial Nerves: No cranial nerve deficit.     Motor: No weakness.   Psychiatric:        Mood and Affect: Mood normal.        Behavior: Behavior normal.      Results for orders placed or performed in visit on 10/16/23  POCT CBG (Fasting - Glucose)  Result Value Ref Range   Glucose Fasting, POC 119 (A) 70 - 99 mg/dL    Recent Results (from the past 2160 hours)  CBC  With Diff/Platelet     Status: Abnormal   Collection Time: 10/14/23 11:09 AM  Result Value Ref Range   WBC 5.0 3.4 - 10.8 x10E3/uL  RBC 4.26 4.14 - 5.80 x10E6/uL   Hemoglobin 12.8 (L) 13.0 - 17.7 g/dL   Hematocrit 16.1 09.6 - 51.0 %   MCV 92 79 - 97 fL   MCH 30.0 26.6 - 33.0 pg   MCHC 32.7 31.5 - 35.7 g/dL   RDW 04.5 40.9 - 81.1 %   Platelets 301 150 - 450 x10E3/uL   Neutrophils 54 Not Estab. %   Lymphs 29 Not Estab. %   Monocytes 9 Not Estab. %   Eos 6 Not Estab. %   Basos 1 Not Estab. %   Neutrophils Absolute 2.7 1.4 - 7.0 x10E3/uL   Lymphocytes Absolute 1.5 0.7 - 3.1 x10E3/uL   Monocytes Absolute 0.4 0.1 - 0.9 x10E3/uL   EOS (ABSOLUTE) 0.3 0.0 - 0.4 x10E3/uL   Basophils Absolute 0.1 0.0 - 0.2 x10E3/uL   Immature Granulocytes 0 Not Estab. %   Immature Grans (Abs) 0.0 0.0 - 0.1 x10E3/uL  Hemoglobin A1c     Status: Abnormal   Collection Time: 10/14/23 11:09 AM  Result Value Ref Range   Hgb A1c MFr Bld 6.4 (H) 4.8 - 5.6 %    Comment:          Prediabetes: 5.7 - 6.4          Diabetes: >6.4          Glycemic control for adults with diabetes: <7.0    Est. average glucose Bld gHb Est-mCnc 137 mg/dL  POCT CBG (Fasting - Glucose)     Status: Abnormal   Collection Time: 10/16/23  9:58 AM  Result Value Ref Range   Glucose Fasting, POC 119 (A) 70 - 99 mg/dL      Assessment & Plan:  As per problem list  Problem List Items Addressed This Visit       Endocrine   Type 2 diabetes mellitus without complication, without long-term current use of insulin  (HCC) - Primary   Relevant Orders   POCT CBG (Fasting - Glucose) (Completed)    No follow-ups on file.   Total time spent: 20 minutes  Arzella Bitters, MD  10/16/2023   This document may have been prepared by [facility] Voice Recognition software and as such may include unintentional dictation errors.

## 2023-12-28 ENCOUNTER — Other Ambulatory Visit: Payer: Self-pay | Admitting: Internal Medicine

## 2023-12-28 DIAGNOSIS — E7800 Pure hypercholesterolemia, unspecified: Secondary | ICD-10-CM

## 2024-01-13 ENCOUNTER — Other Ambulatory Visit

## 2024-01-13 LAB — HEMOGLOBIN A1C
Est. average glucose Bld gHb Est-mCnc: 131 mg/dL
Hgb A1c MFr Bld: 6.2 % — ABNORMAL HIGH (ref 4.8–5.6)

## 2024-01-14 LAB — COMPREHENSIVE METABOLIC PANEL WITH GFR
ALT: 16 IU/L (ref 0–44)
AST: 26 IU/L (ref 0–40)
Albumin: 3.9 g/dL (ref 3.9–4.9)
Alkaline Phosphatase: 56 IU/L (ref 49–135)
BUN/Creatinine Ratio: 19 (ref 10–24)
BUN: 20 mg/dL (ref 8–27)
Bilirubin Total: 0.4 mg/dL (ref 0.0–1.2)
CO2: 19 mmol/L — ABNORMAL LOW (ref 20–29)
Calcium: 9 mg/dL (ref 8.6–10.2)
Chloride: 105 mmol/L (ref 96–106)
Creatinine, Ser: 1.08 mg/dL (ref 0.76–1.27)
Globulin, Total: 2.6 g/dL (ref 1.5–4.5)
Glucose: 114 mg/dL — ABNORMAL HIGH (ref 70–99)
Potassium: 4.7 mmol/L (ref 3.5–5.2)
Sodium: 139 mmol/L (ref 134–144)
Total Protein: 6.5 g/dL (ref 6.0–8.5)
eGFR: 74 mL/min/1.73 (ref >59)

## 2024-01-15 ENCOUNTER — Ambulatory Visit (INDEPENDENT_AMBULATORY_CARE_PROVIDER_SITE_OTHER): Admitting: Internal Medicine

## 2024-01-15 VITALS — BP 118/72 | HR 51 | Ht 72.0 in | Wt 198.0 lb

## 2024-01-15 DIAGNOSIS — E119 Type 2 diabetes mellitus without complications: Secondary | ICD-10-CM | POA: Diagnosis not present

## 2024-01-15 DIAGNOSIS — I10 Essential (primary) hypertension: Secondary | ICD-10-CM | POA: Diagnosis not present

## 2024-01-15 DIAGNOSIS — N5314 Retrograde ejaculation: Secondary | ICD-10-CM | POA: Diagnosis not present

## 2024-01-15 DIAGNOSIS — E7800 Pure hypercholesterolemia, unspecified: Secondary | ICD-10-CM | POA: Diagnosis not present

## 2024-01-15 LAB — POCT CBG (FASTING - GLUCOSE)-MANUAL ENTRY: Glucose Fasting, POC: 106 mg/dL — AB (ref 70–99)

## 2024-01-15 MED ORDER — METFORMIN HCL ER 500 MG PO TB24: 500.0000 mg | ORAL_TABLET | Freq: Every day | ORAL | 0 refills | Status: DC

## 2024-01-15 MED ORDER — PIOGLITAZONE HCL 30 MG PO TABS: 30.0000 mg | ORAL_TABLET | Freq: Every day | ORAL | 0 refills | Status: DC

## 2024-01-15 NOTE — Progress Notes (Unsigned)
 Established Patient Office Visit  Subjective:  Patient ID: Steven Buck, male    DOB: 1950-11-05  Age: 73 y.o. MRN: [id number]  No chief complaint on file.   No new complaints except retrograde ejaculation for several months, here for lab review and medication refills. Labs reviewed and notable for well controlled diabetes, A1c at target with unremarkable cmp. Denies any hypoglycemic episodes and home bg readings have been at target.     No other concerns at this time.   Past Medical History:  Diagnosis Date   Ankylosis of right knee    Kenyonna Micek/p revision TKA 11-05-2022   Benign localized prostatic hyperplasia with lower urinary tract symptoms (LUTS)    Diverticulosis of colon    Hyperlipidemia    OA (osteoarthritis)    Type 2 diabetes mellitus (HCC)    followed by pcp   (12-11-2022  per pt does not check blood sugar)    Past Surgical History:  Procedure Laterality Date   COLONOSCOPY W/ BIOPSIES  2016   KNEE CLOSED REDUCTION Right 12/17/2022   Procedure: CLOSED MANIPULATION KNEE;  Surgeon: Edna Toribio LABOR, MD;  Location: [facility];  Service: Orthopedics;  Laterality: Right;   TOTAL KNEE ARTHROPLASTY Right 10/19/2020   @[facility]  by dr jane   TOTAL KNEE REVISION Right 11/05/2022   Procedure: TOTAL KNEE REVISION;  Surgeon: Edna Toribio LABOR, MD;  Location: [facility];  Service: Orthopedics;  Laterality: Right;    Social History   Socioeconomic History   Marital status: Married    Spouse name: Not on file   Number of children: Not on file   Years of education: Not on file   Highest education level: Not on file  Occupational History   Occupation: Janitor  Tobacco Use   Smoking status: Never   Smokeless tobacco: Never  Vaping Use   Vaping status: Never Used  Substance and Sexual Activity   Alcohol  use: Yes    Comment: occasional beer   Drug use: Never   Sexual activity: Yes  Other Topics Concern   Not on file  Social History Narrative   Not on  file   Social Drivers of Health   Financial Resource Strain: Not on file  Food Insecurity: No Food Insecurity (11/05/2022)   Hunger Vital Sign    Worried About Running Out of Food in the Last Year: Never true    Ran Out of Food in the Last Year: Never true  Transportation Needs: No Transportation Needs (11/05/2022)   PRAPARE - Administrator, Civil Service (Medical): No    Lack of Transportation (Non-Medical): No  Physical Activity: Not on file  Stress: Not on file  Social Connections: Not on file  Intimate Partner Violence: Not At Risk (11/05/2022)   Humiliation, Afraid, Rape, and Kick questionnaire    Fear of Current or Ex-Partner: No    Emotionally Abused: No    Physically Abused: No    Sexually Abused: No    No family history on file.  No Known Allergies  Outpatient Medications Prior to Visit  Medication Sig   atorvastatin  (LIPITOR) 10 MG tablet TAKE 1 TABLET BY MOUTH EVERYDAY AT BEDTIME   Cinnamon 500 MG TABS Take 500 mg by mouth every evening.   Pseudoephedrine-Acetaminophen  (SM NON-ASPRIN SINUS PO) Take 81 1e11 Vector Genomes by mouth Nightly.   [DISCONTINUED] metFORMIN  (GLUCOPHAGE -XR) 500 MG 24 hr tablet Take 1 tablet (500 mg total) by mouth daily with breakfast.   [DISCONTINUED] pioglitazone  (ACTOS )  30 MG tablet Take 1 tablet (30 mg total) by mouth daily.   No facility-administered medications prior to visit.    Review of Systems  Constitutional:  Positive for weight loss (3 lbs).  HENT: Negative.    Eyes: Negative.   Respiratory: Negative.    Cardiovascular: Negative.   Gastrointestinal: Negative.   Genitourinary:  Positive for frequency (occasional nocturia).       Delayed ejaculation  Musculoskeletal:  Positive for joint pain.       Right knee arthralgia  Skin: Negative.   Neurological: Negative.  Negative for dizziness and headaches.  Endo/Heme/Allergies: Negative.   Psychiatric/Behavioral: Negative.         Objective:   BP 118/72   Pulse  (!) 51   Ht 6' (1.829 m)   Wt 198 lb (89.8 kg)   SpO2 96%   BMI 26.85 kg/m   Vitals:   01/15/24 0859  BP: 118/72  Pulse: (!) 51  Height: 6' (1.829 m)  Weight: 198 lb (89.8 kg)  SpO2: 96%  BMI (Calculated): 26.85    Physical Exam Vitals reviewed.  Constitutional:      Appearance: Normal appearance.  HENT:     Head: Normocephalic.     Left Ear: There is no impacted cerumen.     Nose: Nose normal.     Mouth/Throat:     Mouth: Mucous membranes are moist.     Pharynx: No posterior oropharyngeal erythema.  Eyes:     Extraocular Movements: Extraocular movements intact.     Pupils: Pupils are equal, round, and reactive to light.  Cardiovascular:     Rate and Rhythm: Regular rhythm.     Chest Wall: PMI is not displaced.     Pulses: Normal pulses.     Heart sounds: Normal heart sounds. No murmur heard. Pulmonary:     Effort: Pulmonary effort is normal.     Breath sounds: Normal air entry. No rhonchi or rales.  Abdominal:     General: Abdomen is flat. Bowel sounds are normal. There is no distension.     Palpations: Abdomen is soft. There is no hepatomegaly, splenomegaly or mass.     Tenderness: There is no abdominal tenderness.  Musculoskeletal:     Cervical back: Normal range of motion and neck supple.     Right lower leg: No edema.     Left lower leg: No edema.     Comments: Limping gait  Skin:    General: Skin is warm and dry.  Neurological:     General: No focal deficit present.     Mental Status: He is alert and oriented to person, place, and time.     Cranial Nerves: No cranial nerve deficit.     Motor: No weakness.  Psychiatric:        Mood and Affect: Mood normal.        Behavior: Behavior normal.      Results for orders placed or performed in visit on 01/15/24  POCT CBG (Fasting - Glucose)  Result Value Ref Range   Glucose Fasting, POC 106 (A) 70 - 99 mg/dL    Recent Results (from the past 2160 hours)  Comprehensive metabolic panel     Status:  Abnormal   Collection Time: 01/13/24 10:22 AM  Result Value Ref Range   Glucose 114 (H) 70 - 99 mg/dL   BUN 20 8 - 27 mg/dL   Creatinine, Ser 8.91 0.76 - 1.27 mg/dL   eGFR 74 >40 fO/fpw/8.26  BUN/Creatinine Ratio 19 10 - 24   Sodium 139 134 - 144 mmol/L   Potassium 4.7 3.5 - 5.2 mmol/L   Chloride 105 96 - 106 mmol/L   CO2 19 (L) 20 - 29 mmol/L   Calcium  9.0 8.6 - 10.2 mg/dL   Total Protein 6.5 6.0 - 8.5 g/dL   Albumin 3.9 3.9 - 4.9 g/dL   Globulin, Total 2.6 1.5 - 4.5 g/dL   Bilirubin Total 0.4 0.0 - 1.2 mg/dL   Alkaline Phosphatase 56 49 - 135 IU/L    Comment:               **Please note reference interval change**   AST 26 0 - 40 IU/L   ALT 16 0 - 44 IU/L  Hemoglobin A1c     Status: Abnormal   Collection Time: 01/13/24 10:23 AM  Result Value Ref Range   Hgb A1c MFr Bld 6.2 (H) 4.8 - 5.6 %    Comment:          Prediabetes: 5.7 - 6.4          Diabetes: >6.4          Glycemic control for adults with diabetes: <7.0    Est. average glucose Bld gHb Est-mCnc 131 mg/dL  POCT CBG (Fasting - Glucose)     Status: Abnormal   Collection Time: 01/15/24  9:04 AM  Result Value Ref Range   Glucose Fasting, POC 106 (A) 70 - 99 mg/dL      Assessment & Plan:  Type 2 diabetes mellitus without complication, without long-term current use of insulin  (HCC) -     POCT CBG (Fasting - Glucose) -     metFORMIN  HCl ER; Take 1 tablet (500 mg total) by mouth daily with breakfast.  Dispense: 90 tablet; Refill: 0 -     Pioglitazone  HCl; Take 1 tablet (30 mg total) by mouth daily.  Dispense: 90 tablet; Refill: 0 -     Hemoglobin A1c -     Lipid panel; Standing  Primary hypertension -     CBC With Diff/Platelet; Future -     Comprehensive metabolic panel with GFR; Future  Pure hypercholesterolemia -     Lipid panel  Retrograde ejaculation -     PSA -     Ambulatory referral to Urology    Problem List Items Addressed This Visit       Cardiovascular and Mediastinum   Primary hypertension    Relevant Orders   CBC With Diff/Platelet   Comprehensive metabolic panel with GFR     Endocrine   Type 2 diabetes mellitus without complication, without long-term current use of insulin  (HCC) - Primary   Relevant Medications   metFORMIN  (GLUCOPHAGE -XR) 500 MG 24 hr tablet   pioglitazone  (ACTOS ) 30 MG tablet   Other Relevant Orders   POCT CBG (Fasting - Glucose) (Completed)   Hemoglobin A1c   Lipid panel     Other   Pure hypercholesterolemia   Relevant Orders   Lipid panel   Other Visit Diagnoses       Retrograde ejaculation       Relevant Orders   PSA   Ambulatory referral to Urology       Return in about 3 months (around 04/15/2024) for awv with labs prior.   Total time spent: 30 minutes  Sherrill Cinderella Perry, MD  01/15/2024   This document may have been prepared by [facility] Voice Recognition software and as such may include unintentional dictation  errors.

## 2024-01-17 ENCOUNTER — Encounter: Payer: Self-pay | Admitting: Internal Medicine

## 2024-01-17 LAB — LIPID PANEL
Chol/HDL Ratio: 2.1 ratio (ref 0.0–5.0)
Cholesterol, Total: 148 mg/dL (ref 100–199)
HDL: 69 mg/dL (ref >39)
LDL Chol Calc (NIH): 67 mg/dL (ref 0–99)
Triglycerides: 55 mg/dL (ref 0–149)
VLDL Cholesterol Cal: 12 mg/dL (ref 5–40)

## 2024-01-17 LAB — SPECIMEN STATUS REPORT

## 2024-01-27 ENCOUNTER — Ambulatory Visit: Payer: Self-pay | Admitting: Urology

## 2024-01-27 ENCOUNTER — Encounter: Payer: Self-pay | Admitting: Urology

## 2024-01-27 VITALS — BP 112/69 | HR 68 | Ht 72.0 in | Wt 195.0 lb

## 2024-01-27 DIAGNOSIS — R5383 Other fatigue: Secondary | ICD-10-CM | POA: Diagnosis not present

## 2024-01-27 DIAGNOSIS — F5232 Male orgasmic disorder: Secondary | ICD-10-CM | POA: Diagnosis not present

## 2024-01-27 NOTE — Progress Notes (Signed)
 01/27/2024 9:02 AM   Steven Buck Feb 14, 1951 [id number]  Referring provider: Albina GORMAN Dine, MD [address] [place],  [place] [address]  Chief Complaint  Patient presents with   Other    Retrograde ejaculation    HPI: Steven Buck is a 73 y.o. male referred for evaluation of retrograde ejaculation  Duration: A little over 1 year He does not have retrograde ejaculation but delayed ejaculation Married/monogamous and has difficulty achieving orgasm.  He is able to achieve orgasm with masturbation No SSRI medications Type 2 diabetes on Actos  and metformin  Denies erectile dysfunction or decreased libido Does have some tiredness/fatigue   PMH: Past Medical History:  Diagnosis Date   Ankylosis of right knee    s/p revision TKA 11-05-2022   Benign localized prostatic hyperplasia with lower urinary tract symptoms (LUTS)    Diverticulosis of colon    Hyperlipidemia    OA (osteoarthritis)    Type 2 diabetes mellitus (HCC)    followed by pcp   (12-11-2022  per pt does not check blood sugar)    Surgical History: Past Surgical History:  Procedure Laterality Date   COLONOSCOPY W/ BIOPSIES  2016   KNEE CLOSED REDUCTION Right 12/17/2022   Procedure: CLOSED MANIPULATION KNEE;  Surgeon: Edna Toribio LABOR, MD;  Location: [facility] [place];  Service: Orthopedics;  Laterality: Right;   TOTAL KNEE ARTHROPLASTY Right 10/19/2020   @[facility]  by dr jane   TOTAL KNEE REVISION Right 11/05/2022   Procedure: TOTAL KNEE REVISION;  Surgeon: Edna Toribio LABOR, MD;  Location: [facility];  Service: Orthopedics;  Laterality: Right;    Home Medications:  Allergies as of 01/27/2024   No Known Allergies      Medication List        Accurate as of January 27, 2024  9:02 AM. If you have any questions, ask your nurse or doctor.          STOP taking these medications    SM NON-ASPRIN SINUS PO       TAKE these medications    atorvastatin  10 MG tablet Commonly known  as: LIPITOR TAKE 1 TABLET BY MOUTH EVERYDAY AT BEDTIME   Cinnamon 500 MG Tabs Take 500 mg by mouth every evening.   metFORMIN  500 MG 24 hr tablet Commonly known as: GLUCOPHAGE -XR Take 1 tablet (500 mg total) by mouth daily with breakfast.   pioglitazone  30 MG tablet Commonly known as: ACTOS  Take 1 tablet (30 mg total) by mouth daily.        Allergies: No Known Allergies  Family History: No family history on file.  Social History:  reports that he has never smoked. He has never used smokeless tobacco. He reports current alcohol  use. He reports that he does not use drugs.   Physical Exam: BP 112/69   Pulse 68   Ht 6' (1.829 m)   Wt 195 lb (88.5 kg)   BMI 26.45 kg/m   Constitutional:  Alert, No acute distress. HEENT: [place] AT Respiratory: Normal respiratory effort, no increased work of breathing. GU: Phallus circumcised without lesions.  Testes descended bilaterally without masses or tenderness.  Normal size Psychiatric: Normal mood and affect.   Assessment & Plan:    1.  Delayed ejaculation No contributing medications Does have some fatigue and testosterone level ordered We discussed this is a difficult problem to treat and could potentially be psychogenic    Steven JAYSON Barba, MD  [facility] [place] [address], [address] [place],   [address] [phone]

## 2024-01-28 LAB — TESTOSTERONE: Testosterone: 751 ng/dL (ref 264–916)

## 2024-01-30 ENCOUNTER — Ambulatory Visit: Payer: Self-pay | Admitting: Urology

## 2024-04-13 ENCOUNTER — Other Ambulatory Visit

## 2024-04-14 LAB — HEMOGLOBIN A1C
Est. average glucose Bld gHb Est-mCnc: 137 mg/dL
Hgb A1c MFr Bld: 6.4 % — ABNORMAL HIGH (ref 4.8–5.6)

## 2024-04-14 LAB — LIPID PANEL
Chol/HDL Ratio: 1.9 ratio (ref 0.0–5.0)
Cholesterol, Total: 175 mg/dL (ref 100–199)
HDL: 94 mg/dL (ref >39)
LDL Chol Calc (NIH): 70 mg/dL (ref 0–99)
Triglycerides: 52 mg/dL (ref 0–149)
VLDL Cholesterol Cal: 11 mg/dL (ref 5–40)

## 2024-04-14 LAB — PSA: Prostate Specific Ag, Serum: 2.4 ng/mL (ref 0.0–4.0)

## 2024-04-15 ENCOUNTER — Encounter: Payer: Self-pay | Admitting: Internal Medicine

## 2024-04-15 ENCOUNTER — Ambulatory Visit: Payer: Self-pay | Admitting: Internal Medicine

## 2024-04-15 ENCOUNTER — Ambulatory Visit: Admitting: Internal Medicine

## 2024-04-15 VITALS — BP 112/73 | HR 48 | Temp 98.2°F | Ht 72.0 in | Wt 194.6 lb

## 2024-04-15 DIAGNOSIS — R0683 Snoring: Secondary | ICD-10-CM | POA: Diagnosis not present

## 2024-04-15 DIAGNOSIS — Z0001 Encounter for general adult medical examination with abnormal findings: Secondary | ICD-10-CM

## 2024-04-15 DIAGNOSIS — E119 Type 2 diabetes mellitus without complications: Secondary | ICD-10-CM

## 2024-04-15 DIAGNOSIS — E7800 Pure hypercholesterolemia, unspecified: Secondary | ICD-10-CM | POA: Diagnosis not present

## 2024-04-15 DIAGNOSIS — E1165 Type 2 diabetes mellitus with hyperglycemia: Secondary | ICD-10-CM

## 2024-04-15 DIAGNOSIS — Z739 Problem related to life management difficulty, unspecified: Secondary | ICD-10-CM

## 2024-04-15 DIAGNOSIS — I10 Essential (primary) hypertension: Secondary | ICD-10-CM

## 2024-04-15 LAB — POC CREATINE & ALBUMIN,URINE
Albumin/Creatinine Ratio, Urine, POC: <30
Creatinine, POC: 300 mg/dL
Microalbumin Ur, POC: 80 mg/L

## 2024-04-15 LAB — POCT CBG (FASTING - GLUCOSE)-MANUAL ENTRY: Glucose Fasting, POC: 109 mg/dL — AB (ref 70–99)

## 2024-04-15 MED ORDER — METFORMIN HCL ER 500 MG PO TB24: 500.0000 mg | ORAL_TABLET | Freq: Every day | ORAL | 0 refills | Status: AC

## 2024-04-15 MED ORDER — PIOGLITAZONE HCL 30 MG PO TABS: 30.0000 mg | ORAL_TABLET | Freq: Every day | ORAL | 0 refills | Status: AC

## 2024-04-15 NOTE — Progress Notes (Signed)
 Established Patient Office Visit  Subjective:  Patient ID: Steven Buck, male    DOB: 1950-10-24  Age: 73 y.o. MRN: [id number]  Chief Complaint  Patient presents with   Annual Exam    AWV / 3 month follow up    No new complaints, here for AWV refer to quality metrics and scanned documents.  Also here for lab review and medication refills. Labs reviewed and notable for well controlled diabetes, A1c higher but remains at target, lipids at target with unremarkable psa. Denies any hypoglycemic episodes and home bg readings have been at target.      No other concerns at this time.   Past Medical History:  Diagnosis Date   Ankylosis of right knee    Steven Buck/p revision TKA 11-05-2022   Benign localized prostatic hyperplasia with lower urinary tract symptoms (LUTS)    Diverticulosis of colon    Hyperlipidemia    OA (osteoarthritis)    Type 2 diabetes mellitus (HCC)    followed by pcp   (12-11-2022  per pt does not check blood sugar)    Past Surgical History:  Procedure Laterality Date   COLONOSCOPY W/ BIOPSIES  2016   KNEE CLOSED REDUCTION Right 12/17/2022   Procedure: CLOSED MANIPULATION KNEE;  Surgeon: Steven Buck LABOR, MD;  Location: [facility] [place];  Service: Orthopedics;  Laterality: Right;   TOTAL KNEE ARTHROPLASTY Right 10/19/2020   @[facility]  by dr Steven Buck   TOTAL KNEE REVISION Right 11/05/2022   Procedure: TOTAL KNEE REVISION;  Surgeon: Steven Buck LABOR, MD;  Location: [facility];  Service: Orthopedics;  Laterality: Right;    Social History   Socioeconomic History   Marital status: Married    Spouse name: Not on file   Number of children: Not on file   Years of education: Not on file   Highest education level: Not on file  Occupational History   Occupation: Janitor  Tobacco Use   Smoking status: Never   Smokeless tobacco: Never  Vaping Use   Vaping status: Never Used  Substance and Sexual Activity   Alcohol  use: Yes    Comment: occasional beer   Drug  use: Never   Sexual activity: Yes  Other Topics Concern   Not on file  Social History Narrative   Not on file   Social Drivers of Health   Tobacco Use: Low Risk (04/15/2024)   Patient History    Smoking Tobacco Use: Never    Smokeless Tobacco Use: Never    Passive Exposure: Not on file  Financial Resource Strain: Not on file  Food Insecurity: No Food Insecurity (11/05/2022)   Hunger Vital Sign    Worried About Running Out of Food in the Last Year: Never true    Ran Out of Food in the Last Year: Never true  Transportation Needs: No Transportation Needs (11/05/2022)   PRAPARE - Administrator, Civil Service (Medical): No    Lack of Transportation (Non-Medical): No  Physical Activity: Not on file  Stress: Not on file  Social Connections: Not on file  Intimate Partner Violence: Not At Risk (11/05/2022)   Humiliation, Afraid, Rape, and Kick questionnaire    Fear of Current or Ex-Partner: No    Emotionally Abused: No    Physically Abused: No    Sexually Abused: No  Depression (PHQ2-9): Low Risk (01/15/2023)   Depression (PHQ2-9)    PHQ-2 Score: 1  Alcohol  Screen: Not on file  Housing: Low Risk (11/05/2022)   Housing  Last Housing Risk Score: 0  Utilities: Not At Risk (11/05/2022)   [facility] Utilities    Threatened with loss of utilities: No  Health Literacy: Not on file    No family history on file.  Allergies[1]  Show/hide medication list[2]  Review of Systems  Constitutional:  Positive for weight loss (4 lbs).  HENT: Negative.    Eyes: Negative.   Respiratory: Negative.    Cardiovascular: Negative.   Gastrointestinal: Negative.   Genitourinary:  Positive for frequency (occasional nocturia).       Delayed ejaculation  Musculoskeletal:  Positive for joint pain.       Right knee arthralgia  Skin: Negative.   Neurological: Negative.  Negative for dizziness and headaches.  Endo/Heme/Allergies: Negative.   Psychiatric/Behavioral: Negative.         Objective:    BP 112/73   Pulse (!) 48   Temp 98.2 F (36.8 C)   Ht 6' (1.829 m)   Wt 194 lb 9.6 oz (88.3 kg)   SpO2 99%   BMI 26.39 kg/m   Vitals:   04/15/24 0951  BP: 112/73  Pulse: (!) 48  Temp: 98.2 F (36.8 C)  Height: 6' (1.829 m)  Weight: 194 lb 9.6 oz (88.3 kg)  SpO2: 99%  BMI (Calculated): 26.39    Physical Exam Vitals reviewed.  Constitutional:      Appearance: Normal appearance.  HENT:     Head: Normocephalic.     Left Ear: There is no impacted cerumen.     Nose: Nose normal.     Mouth/Throat:     Mouth: Mucous membranes are moist.     Pharynx: No posterior oropharyngeal erythema.  Eyes:     Extraocular Movements: Extraocular movements intact.     Pupils: Pupils are equal, round, and reactive to light.  Cardiovascular:     Rate and Rhythm: Regular rhythm.     Chest Wall: PMI is not displaced.     Pulses: Normal pulses.     Heart sounds: Normal heart sounds. No murmur heard. Pulmonary:     Effort: Pulmonary effort is normal.     Breath sounds: Normal air entry. No rhonchi or rales.  Abdominal:     General: Abdomen is flat. Bowel sounds are normal. There is no distension.     Palpations: Abdomen is soft. There is no hepatomegaly, splenomegaly or mass.     Tenderness: There is no abdominal tenderness.  Musculoskeletal:     Cervical back: Normal range of motion and neck supple.     Right lower leg: No edema.     Left lower leg: No edema.     Comments: Limping gait  Skin:    General: Skin is warm and dry.  Neurological:     General: No focal deficit present.     Mental Status: He is alert and oriented to person, place, and time.     Cranial Nerves: No cranial nerve deficit.     Motor: No weakness.  Psychiatric:        Mood and Affect: Mood normal.        Behavior: Behavior normal.      Results for orders placed or performed in visit on 04/15/24  POCT CBG (Fasting - Glucose)  Result Value Ref Range   Glucose Fasting, POC 109 (A) 70 - 99 mg/dL     Recent Results (from the past 2160 hours)  Testosterone      Status: None   Collection Time: 01/27/24  9:35 AM  Result Value Ref Range   Testosterone  751 264 - 916 ng/dL    Comment: Adult male reference interval is based on a population of healthy nonobese males (BMI <30) between 82 and 47 years old. Travison, et.al. JCEM [phone]. PMID: [id number].   Hemoglobin A1c     Status: Abnormal   Collection Time: 04/13/24 10:04 AM  Result Value Ref Range   Hgb A1c MFr Bld 6.4 (H) 4.8 - 5.6 %    Comment:          Prediabetes: 5.7 - 6.4          Diabetes: >6.4          Glycemic control for adults with diabetes: <7.0    Est. average glucose Bld gHb Est-mCnc 137 mg/dL  PSA     Status: None   Collection Time: 04/13/24 10:04 AM  Result Value Ref Range   Prostate Specific Ag, Serum 2.4 0.0 - 4.0 ng/mL    Comment: Roche ECLIA methodology. According to the American Urological Association, Serum PSA should decrease and remain at undetectable levels after radical prostatectomy. The AUA defines biochemical recurrence as an initial PSA value 0.2 ng/mL or greater followed by a subsequent confirmatory PSA value 0.2 ng/mL or greater. Values obtained with different assay methods or kits cannot be used interchangeably. Results cannot be interpreted as absolute evidence of the presence or absence of malignant disease.   Lipid panel     Status: None   Collection Time: 04/13/24 10:05 AM  Result Value Ref Range   Cholesterol, Total 175 100 - 199 mg/dL   Triglycerides 52 0 - 149 mg/dL   HDL 94 >60 mg/dL   VLDL Cholesterol Cal 11 5 - 40 mg/dL   LDL Chol Calc (NIH) 70 0 - 99 mg/dL   Chol/HDL Ratio 1.9 0.0 - 5.0 ratio    Comment:                                   T. Chol/HDL Ratio                                             Men  Women                               1/2 Avg.Risk  3.4    3.3                                   Avg.Risk  5.0    4.4                                2X Avg.Risk  9.6     7.1                                3X Avg.Risk 23.4   11.0   POCT CBG (Fasting - Glucose)     Status: Abnormal   Collection Time: 04/15/24  9:59 AM  Result Value Ref Range   Glucose Fasting, POC 109 (A) 70 - 99 mg/dL  Assessment & Plan:  Emanual was seen today for annual exam.  Type 2 diabetes mellitus without complication, without long-term current use of insulin  (HCC) -     POCT CBG (Fasting - Glucose) -     POC CREATINE & ALBUMIN,URINE -     metFORMIN  HCl ER; Take 1 tablet (500 mg total) by mouth daily with breakfast.  Dispense: 90 tablet; Refill: 0 -     Pioglitazone  HCl; Take 1 tablet (30 mg total) by mouth daily.  Dispense: 90 tablet; Refill: 0  Primary hypertension  Pure hypercholesterolemia  Snoring -     Ambulatory referral to Sleep Studies    Problem List Items Addressed This Visit       Cardiovascular and Mediastinum   Primary hypertension     Endocrine   Type 2 diabetes mellitus without complication, without long-term current use of insulin  (HCC) - Primary   Relevant Medications   metFORMIN  (GLUCOPHAGE -XR) 500 MG 24 hr tablet   pioglitazone  (ACTOS ) 30 MG tablet   Other Relevant Orders   POCT CBG (Fasting - Glucose) (Completed)   POC CREATINE & ALBUMIN,URINE     Other   Pure hypercholesterolemia   Other Visit Diagnoses       Snoring       Relevant Orders   Ambulatory referral to Sleep Studies       Return in 3 months (on 07/14/2024) for fu with labs prior.   Total time spent: 30 minutes. This time includes review of previous notes and results and patient face to face interaction during today'[place] visit.    Sherrill Cinderella Perry, MD  04/15/2024   This document may have been prepared by [facility] Voice Recognition software and as such may include unintentional dictation errors.     [1] No Known Allergies [2]  Outpatient Medications Prior to Visit  Medication Sig   atorvastatin  (LIPITOR) 10 MG tablet TAKE 1 TABLET BY MOUTH EVERYDAY AT  BEDTIME   Cinnamon 500 MG TABS Take 500 mg by mouth every evening.   [DISCONTINUED] metFORMIN  (GLUCOPHAGE -XR) 500 MG 24 hr tablet Take 1 tablet (500 mg total) by mouth daily with breakfast.   [DISCONTINUED] pioglitazone  (ACTOS ) 30 MG tablet Take 1 tablet (30 mg total) by mouth daily.   No facility-administered medications prior to visit.

## 2024-06-26 ENCOUNTER — Encounter
